# Patient Record
Sex: Male | Born: 1945 | Race: White | Hispanic: No | Marital: Married | Smoking: Never smoker
Health system: Southern US, Community
[De-identification: ages and names within clinical notes are randomized; demographics above are authoritative.]

## PROBLEM LIST (undated history)

## (undated) DIAGNOSIS — J189 Pneumonia, unspecified organism: Secondary | ICD-10-CM

## (undated) DIAGNOSIS — R972 Elevated prostate specific antigen [PSA]: Secondary | ICD-10-CM

## (undated) DIAGNOSIS — I10 Essential (primary) hypertension: Secondary | ICD-10-CM

## (undated) DIAGNOSIS — D126 Benign neoplasm of colon, unspecified: Secondary | ICD-10-CM

## (undated) DIAGNOSIS — K219 Gastro-esophageal reflux disease without esophagitis: Secondary | ICD-10-CM

## (undated) DIAGNOSIS — N400 Enlarged prostate without lower urinary tract symptoms: Secondary | ICD-10-CM

## (undated) DIAGNOSIS — C8510 Unspecified B-cell lymphoma, unspecified site: Secondary | ICD-10-CM

## (undated) DIAGNOSIS — N183 Chronic kidney disease, stage 3 unspecified: Secondary | ICD-10-CM

## (undated) DIAGNOSIS — K118 Other diseases of salivary glands: Secondary | ICD-10-CM

## (undated) DIAGNOSIS — J45909 Unspecified asthma, uncomplicated: Secondary | ICD-10-CM

## (undated) DIAGNOSIS — N200 Calculus of kidney: Secondary | ICD-10-CM

## (undated) DIAGNOSIS — Z973 Presence of spectacles and contact lenses: Secondary | ICD-10-CM

## (undated) HISTORY — PX: TONSILLECTOMY: SUR1361

## (undated) HISTORY — PX: LITHOTRIPSY: SUR834

## (undated) HISTORY — PX: COLONOSCOPY W/ BIOPSIES AND POLYPECTOMY: SHX1376

## (undated) HISTORY — PX: ROTATOR CUFF REPAIR: SHX139

---

## 2004-04-19 ENCOUNTER — Ambulatory Visit: Payer: Self-pay | Admitting: Orthopaedic Surgery

## 2004-05-03 ENCOUNTER — Ambulatory Visit: Payer: Self-pay | Admitting: Orthopaedic Surgery

## 2005-07-30 ENCOUNTER — Ambulatory Visit: Payer: Self-pay | Admitting: Internal Medicine

## 2006-10-02 ENCOUNTER — Ambulatory Visit: Payer: Self-pay | Admitting: Gastroenterology

## 2010-01-14 ENCOUNTER — Ambulatory Visit: Payer: Self-pay | Admitting: Sports Medicine

## 2010-07-26 ENCOUNTER — Ambulatory Visit: Payer: Self-pay | Admitting: Internal Medicine

## 2010-08-09 ENCOUNTER — Ambulatory Visit: Payer: Self-pay | Admitting: Rheumatology

## 2010-10-17 ENCOUNTER — Encounter: Payer: Self-pay | Admitting: Orthopedic Surgery

## 2010-11-04 ENCOUNTER — Encounter: Payer: Self-pay | Admitting: Orthopedic Surgery

## 2012-02-20 IMAGING — NM NUCLEAR MEDICINE WHOLE BODY BONE SCINTIGRAPHY
2 series · 8 of 8 positions shown · non-contrast
Comparison: none

REASON FOR EXAM: abd pain and lower back pain  abn CT scan  L1 scan
abnormal   dr req Whole Body
COMMENTS:

[Series 1000: statics · 2.40mm/px · 3 acquisitions, 6 frames shown]
[im 1/3]
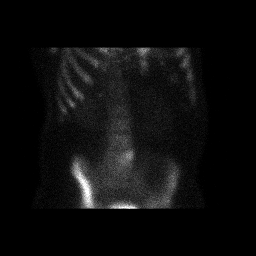
[im 1/3]
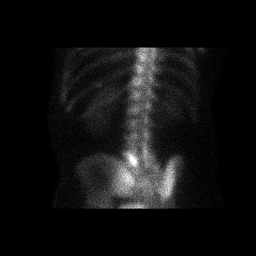
[im 2/3]
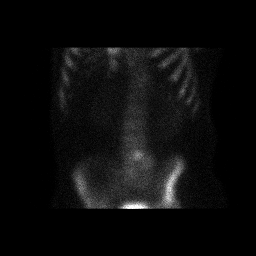
[im 2/3]
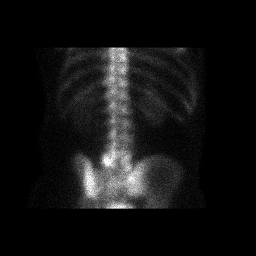
[im 3/3]
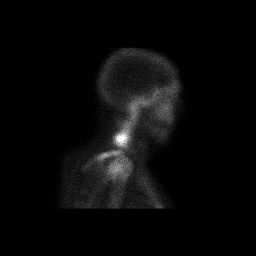
[im 3/3]
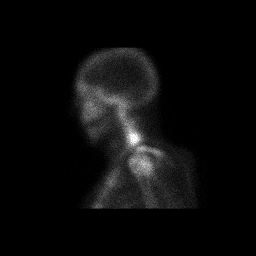

[Series 1000: 3 hr wholebody · 2.40mm/px · 2 of 2 frames shown]
[frame 1/2]
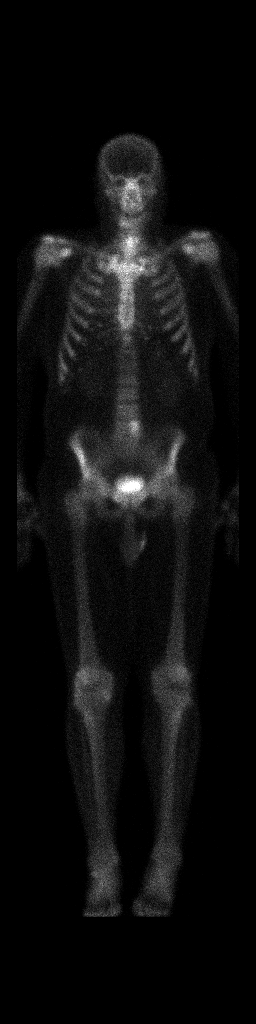
[frame 2/2]
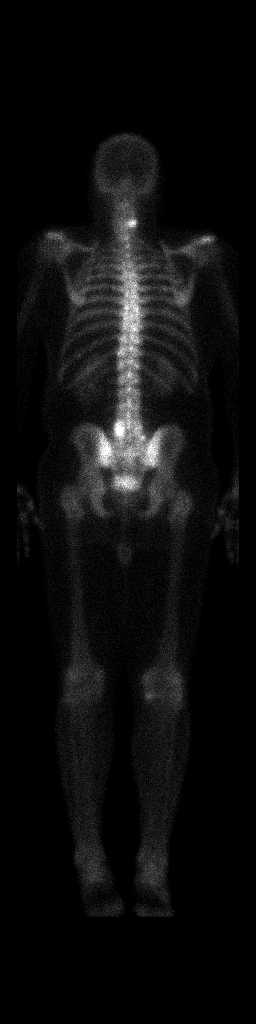

[8 of 8 positions shown; findings below may reference images not displayed]

PROCEDURE:     KNM - KNM BONE WB 3HR [DATE]  [DATE]

RESULT:     The patient received an injection of 23.69 mCi of technetium 99m
MDP. Anterior and posterior whole body images demonstrate focal increased
localization in the mid cervical region on the right and in the lower lumbar
region at L4 on the left with degenerative type uptake to a mild degree in
the knees and ankles. No abnormal localization is seen within the pelvis or
ribs. Uptake in the wrists is incompletely visualized and likely
degenerative in nature. The area of sclerosis on L1 seen on CT does not show
abnormal tracer uptake.
IMPRESSION: Likely degenerative changes in the spine in the cervical
and lumbar regions as well as in other joints as described.

## 2012-09-06 ENCOUNTER — Ambulatory Visit: Payer: Self-pay | Admitting: Gastroenterology

## 2013-06-24 ENCOUNTER — Observation Stay: Payer: Self-pay | Admitting: Internal Medicine

## 2013-06-24 LAB — CBC WITH DIFFERENTIAL/PLATELET
Basophil #: 0.1 10*3/uL (ref 0.0–0.1)
Basophil %: 0.7 %
EOS ABS: 0.5 10*3/uL (ref 0.0–0.7)
EOS PCT: 5 %
HCT: 42.4 % (ref 40.0–52.0)
HGB: 14.3 g/dL (ref 13.0–18.0)
LYMPHS ABS: 3.2 10*3/uL (ref 1.0–3.6)
Lymphocyte %: 34.8 %
MCH: 31.8 pg (ref 26.0–34.0)
MCHC: 33.7 g/dL (ref 32.0–36.0)
MCV: 94 fL (ref 80–100)
MONO ABS: 0.8 x10 3/mm (ref 0.2–1.0)
MONOS PCT: 8.5 %
Neutrophil #: 4.7 10*3/uL (ref 1.4–6.5)
Neutrophil %: 51 %
Platelet: 196 10*3/uL (ref 150–440)
RBC: 4.49 10*6/uL (ref 4.40–5.90)
RDW: 12.6 % (ref 11.5–14.5)
WBC: 9.1 10*3/uL (ref 3.8–10.6)

## 2013-06-24 LAB — URINALYSIS, COMPLETE
BACTERIA: NONE SEEN
BILIRUBIN, UR: NEGATIVE
Blood: NEGATIVE
Glucose,UR: NEGATIVE mg/dL (ref 0–75)
Ketone: NEGATIVE
Leukocyte Esterase: NEGATIVE
NITRITE: NEGATIVE
PH: 6 (ref 4.5–8.0)
PROTEIN: NEGATIVE
RBC, UR: NONE SEEN /HPF (ref 0–5)
SPECIFIC GRAVITY: 1.015 (ref 1.003–1.030)
Squamous Epithelial: <1
WBC UR: NONE SEEN /HPF (ref 0–5)

## 2013-06-24 LAB — BASIC METABOLIC PANEL
Anion Gap: 10 (ref 7–16)
BUN: 19 mg/dL — AB (ref 7–18)
CHLORIDE: 104 mmol/L (ref 98–107)
Calcium, Total: 8.9 mg/dL (ref 8.5–10.1)
Co2: 27 mmol/L (ref 21–32)
Creatinine: 0.99 mg/dL (ref 0.60–1.30)
EGFR (African American): >60
EGFR (Non-African Amer.): >60
GLUCOSE: 99 mg/dL (ref 65–99)
Osmolality: 284 (ref 275–301)
POTASSIUM: 3.6 mmol/L (ref 3.5–5.1)
Sodium: 141 mmol/L (ref 136–145)

## 2013-06-24 LAB — TROPONIN I: Troponin-I: <0.02 ng/mL

## 2013-06-25 LAB — LIPID PANEL
Cholesterol: 163 mg/dL (ref 0–200)
HDL Cholesterol: 37 mg/dL — ABNORMAL LOW (ref 40–60)
LDL CHOLESTEROL, CALC: 100 mg/dL (ref 0–100)
TRIGLYCERIDES: 130 mg/dL (ref 0–200)
VLDL CHOLESTEROL, CALC: 26 mg/dL (ref 5–40)

## 2013-06-25 LAB — SEDIMENTATION RATE: ERYTHROCYTE SED RATE: 10 mm/h (ref 0–20)

## 2013-06-26 LAB — CBC WITH DIFFERENTIAL/PLATELET
BASOS ABS: 0 10*3/uL (ref 0.0–0.1)
BASOS PCT: 0.6 %
Eosinophil #: 0.4 10*3/uL (ref 0.0–0.7)
Eosinophil %: 4.2 %
HCT: 39.2 % — AB (ref 40.0–52.0)
HGB: 13.3 g/dL (ref 13.0–18.0)
LYMPHS PCT: 25.6 %
Lymphocyte #: 2.2 10*3/uL (ref 1.0–3.6)
MCH: 31.7 pg (ref 26.0–34.0)
MCHC: 33.9 g/dL (ref 32.0–36.0)
MCV: 94 fL (ref 80–100)
Monocyte #: 0.8 x10 3/mm (ref 0.2–1.0)
Monocyte %: 8.9 %
NEUTROS PCT: 60.7 %
Neutrophil #: 5.2 10*3/uL (ref 1.4–6.5)
Platelet: 175 10*3/uL (ref 150–440)
RBC: 4.2 10*6/uL — ABNORMAL LOW (ref 4.40–5.90)
RDW: 12.3 % (ref 11.5–14.5)
WBC: 8.5 10*3/uL (ref 3.8–10.6)

## 2013-06-26 LAB — BASIC METABOLIC PANEL
Anion Gap: 2 — ABNORMAL LOW (ref 7–16)
BUN: 16 mg/dL (ref 7–18)
CO2: 28 mmol/L (ref 21–32)
CREATININE: 0.97 mg/dL (ref 0.60–1.30)
Calcium, Total: 8.7 mg/dL (ref 8.5–10.1)
Chloride: 109 mmol/L — ABNORMAL HIGH (ref 98–107)
EGFR (African American): >60
EGFR (Non-African Amer.): >60
Glucose: 93 mg/dL (ref 65–99)
Osmolality: 278 (ref 275–301)
POTASSIUM: 3.7 mmol/L (ref 3.5–5.1)
Sodium: 139 mmol/L (ref 136–145)

## 2014-02-06 ENCOUNTER — Ambulatory Visit: Payer: Self-pay | Admitting: Orthopedic Surgery

## 2014-03-15 ENCOUNTER — Other Ambulatory Visit (HOSPITAL_COMMUNITY): Payer: Self-pay | Admitting: Neurosurgery

## 2014-03-20 NOTE — Pre-Procedure Instructions (Addendum)
Sean Kidd  03/20/2014   Your procedure is scheduled on:  Thursday February 18th  Report to [facility] Admitting at 7:30 AM ( per MD)  Call this number if you have problems the morning of surgery: [phone]   If you have questions before your surgery call pre-admission [phone] between 8am-4pm M-F   Remember:   Do not eat food or drink liquids after midnight.   Take these medicines the morning of surgery with A SIP OF WATER: None  Stop taking Aspirin,vitamins, and herbal medications. Do not take any NSAIDs ie: Ibuprofen, Advil, Naproxen or any medication containing Aspirin; stop now.   Do not wear jewelry, make-up or nail polish.  Do not wear lotions, powders, or perfumes. You may not wear deodorant.  Do not shave 48 hours prior to surgery. Men may shave face and neck.  Do not bring valuables to the hospital.  [facility] is not responsible for any belongings or valuables.               Contacts, dentures or bridgework may not be worn into surgery.  Leave suitcase in the car. After surgery it may be brought to your room.  For patients admitted to the hospital, discharge time is determined by your treatment team.               Patients discharged the day of surgery will not be allowed to drive home.  Name and phone number of your driver:   Special Instructions: Special Instructions:Special Instructions: [facility] - Preparing for Surgery  Before surgery, you can play an important role.  Because skin is not sterile, your skin needs to be as free of germs as possible.  You can reduce the number of germs on you skin by washing with CHG (chlorahexidine gluconate) soap before surgery.  CHG is an antiseptic cleaner which kills germs and bonds with the skin to continue killing germs even after washing.  Please DO NOT use if you have an allergy to CHG or antibacterial soaps.  If your skin becomes reddened/irritated stop using the CHG and inform your nurse when you arrive at  Short Stay.  Do not shave (including legs and underarms) for at least 48 hours prior to the first CHG shower.  You may shave your face.  Please follow these instructions carefully:   1.  Shower with CHG Soap the night before surgery and the morning of Surgery.  2.  If you choose to wash your hair, wash your hair first as usual with your normal shampoo.  3.  After you shampoo, rinse your hair and body thoroughly to remove the Shampoo.  4.  Use CHG as you would any other liquid soap.  You can apply chg directly  to the skin and wash gently with scrungie or a clean washcloth.  5.  Apply the CHG Soap to your body ONLY FROM THE NECK DOWN.  Do not use on open wounds or open sores.  Avoid contact with your eyes, ears, mouth and genitals (private parts).  Wash genitals (private parts) with your normal soap.  6.  Wash thoroughly, paying special attention to the area where your surgery will be performed.  7.  Thoroughly rinse your body with warm water from the neck down.  8.  DO NOT shower/wash with your normal soap after using and rinsing off the CHG Soap.  9.  Pat yourself dry with a clean towel.  10.  Wear clean pajamas.            11.  Place clean sheets on your bed the night of your first shower and do not sleep with pets.  Day of Surgery  Do not apply any lotions/deodorants the morning of surgery.  Please wear clean clothes to the hospital/surgery center.   Please read over the following fact sheets that you were given: Pain Booklet, Coughing and Deep Breathing, MRSA Information and Surgical Site Infection Prevention

## 2014-03-21 ENCOUNTER — Encounter (HOSPITAL_COMMUNITY): Payer: Self-pay

## 2014-03-21 ENCOUNTER — Encounter (HOSPITAL_COMMUNITY)
Admission: RE | Admit: 2014-03-21 | Discharge: 2014-03-21 | Disposition: A | Discharge status: Home / Self Care | Payer: Medicare Other | Source: Ambulatory Visit | Attending: Neurosurgery | Admitting: Neurosurgery

## 2014-03-21 HISTORY — DX: Pneumonia, unspecified organism: J18.9

## 2014-03-21 HISTORY — DX: Calculus of kidney: N20.0

## 2014-03-21 HISTORY — DX: Essential (primary) hypertension: I10

## 2014-03-21 HISTORY — DX: Presence of spectacles and contact lenses: Z97.3

## 2014-03-21 HISTORY — DX: Unspecified asthma, uncomplicated: J45.909

## 2014-03-21 LAB — BASIC METABOLIC PANEL
Anion gap: 7 (ref 5–15)
BUN: 20 mg/dL (ref 6–23)
CALCIUM: 9.4 mg/dL (ref 8.4–10.5)
CHLORIDE: 107 mmol/L (ref 96–112)
CO2: 28 mmol/L (ref 19–32)
CREATININE: 0.88 mg/dL (ref 0.50–1.35)
GFR calc Af Amer: >90 mL/min (ref >90)
GFR calc non Af Amer: 85 mL/min — ABNORMAL LOW (ref >90)
Glucose, Bld: 103 mg/dL — ABNORMAL HIGH (ref 70–99)
Potassium: 3.9 mmol/L (ref 3.5–5.1)
Sodium: 142 mmol/L (ref 135–145)

## 2014-03-21 LAB — CBC
HCT: 40.5 % (ref 39.0–52.0)
Hemoglobin: 13.8 g/dL (ref 13.0–17.0)
MCH: 31.4 pg (ref 26.0–34.0)
MCHC: 34.1 g/dL (ref 30.0–36.0)
MCV: 92 fL (ref 78.0–100.0)
PLATELETS: 205 10*3/uL (ref 150–400)
RBC: 4.4 MIL/uL (ref 4.22–5.81)
RDW: 12.7 % (ref 11.5–15.5)
WBC: 8.9 10*3/uL (ref 4.0–10.5)

## 2014-03-21 LAB — SURGICAL PCR SCREEN
MRSA, PCR: NEGATIVE
Staphylococcus aureus: NEGATIVE

## 2014-03-21 NOTE — Progress Notes (Signed)
   03/21/14 1445  OBSTRUCTIVE SLEEP APNEA  Have you ever been diagnosed with sleep apnea through a sleep study? No  Do you snore loudly (loud enough to be heard through closed doors)?  0  Do you often feel tired, fatigued, or sleepy during the daytime? 0  Has anyone observed you stop breathing during your sleep? 0  Do you have, or are you being treated for high blood pressure? 1  BMI more than 35 kg/m2? 0  Age over 71 years old? 1  Neck circumference greater than 40 cm/16 inches? 1  Gender: 1  Obstructive Sleep Apnea Score 4

## 2014-03-21 NOTE — Progress Notes (Signed)
Pt denies SOB, chest pain, and being under the care of a cardiologist. Pt denies having a chest x ray within the last year. Pt denies having a stress test and cardiac cath but stated that he had an EKG and echo at [facility]; records requested

## 2014-03-22 MED ORDER — CEFAZOLIN SODIUM-DEXTROSE 2-3 GM-% IV SOLR
2.0000 g | INTRAVENOUS | Status: AC
  Administered 2014-03-23: 2 g via INTRAVENOUS
  Filled 2014-03-22: qty 50

## 2014-03-22 NOTE — Progress Notes (Signed)
Anesthesia Chart Review: Patient is a 69 year old male scheduled for left L3-4 laminectomy and foraminotomy for synovial cyst on 03/23/14 by Dr. Arnoldo Morale.  History includes non-smoker, asthma, HTN. He was admitted to [facility] on 06/24/13 with visual changes and headache.  By notes MRI, carotid, echo, and sed rate were "normal."  There was question if he had a TIA. BMI is consistent with mild obesity.  OSA screening score is 4. PCP is listed as Dr. Emily Filbert (see Care Everywhere).   06/24/13 EKG [facility]): NSR, inferior infarct (age undetermined), cannot rule out anterior infarct (age undetermined).  Confirming physician indicates, "When compared with ECG of 21-Apr-2000 13:53, No significant change was found."  06/25/13 Echo [facility]): LV internal cavity size is normal. LV septal wall thickness was normal. LV posterior wall thickness was normal. Global LV systolic function was normal. LVEF by visual estimation is 55-60%. RV size is mildly enlarged. LA is mildly dilated. Trace MR.   06/25/13 Carotid duplex [facility]): No hemodynamically significant stenosis or plaque is noted in either cervical carotid artery.   Preoperative labs noted.   By records, his EKG has been stable since 2002.  His echo within the past year was unremarkable.  No CV symptoms reported at his PAT RN visit.  If no acute changes then I would anticipate that he could proceed as planned.  George Hugh [facility] Short Stay Center/Anesthesiology Phone [phone] 03/22/2014 11:55 AM

## 2014-03-23 ENCOUNTER — Inpatient Hospital Stay (HOSPITAL_COMMUNITY)
Admission: RE | Admit: 2014-03-23 | Discharge: 2014-03-24 | DRG: 520 | Disposition: A | Discharge status: Home / Self Care | Payer: Medicare Other | Source: Ambulatory Visit | Attending: Neurosurgery | Admitting: Neurosurgery

## 2014-03-23 ENCOUNTER — Encounter (HOSPITAL_COMMUNITY): Payer: Self-pay | Admitting: *Deleted

## 2014-03-23 ENCOUNTER — Encounter (HOSPITAL_COMMUNITY)
Admission: RE | Disposition: A | Discharge status: Home / Self Care | Payer: Self-pay | Source: Ambulatory Visit | Attending: Neurosurgery

## 2014-03-23 ENCOUNTER — Inpatient Hospital Stay (HOSPITAL_COMMUNITY): Payer: Medicare Other

## 2014-03-23 ENCOUNTER — Inpatient Hospital Stay (HOSPITAL_COMMUNITY): Payer: Medicare Other | Admitting: Vascular Surgery

## 2014-03-23 ENCOUNTER — Inpatient Hospital Stay (HOSPITAL_COMMUNITY): Payer: Medicare Other | Admitting: Anesthesiology

## 2014-03-23 DIAGNOSIS — Z86010 Personal history of colonic polyps: Secondary | ICD-10-CM

## 2014-03-23 DIAGNOSIS — Z79899 Other long term (current) drug therapy: Secondary | ICD-10-CM | POA: Diagnosis not present

## 2014-03-23 DIAGNOSIS — M4806 Spinal stenosis, lumbar region: Secondary | ICD-10-CM | POA: Diagnosis present

## 2014-03-23 DIAGNOSIS — I10 Essential (primary) hypertension: Secondary | ICD-10-CM | POA: Diagnosis present

## 2014-03-23 DIAGNOSIS — M7138 Other bursal cyst, other site: Principal | ICD-10-CM | POA: Diagnosis present

## 2014-03-23 DIAGNOSIS — M5416 Radiculopathy, lumbar region: Secondary | ICD-10-CM | POA: Diagnosis present

## 2014-03-23 DIAGNOSIS — M4316 Spondylolisthesis, lumbar region: Secondary | ICD-10-CM | POA: Diagnosis present

## 2014-03-23 DIAGNOSIS — Z87442 Personal history of urinary calculi: Secondary | ICD-10-CM

## 2014-03-23 DIAGNOSIS — J45909 Unspecified asthma, uncomplicated: Secondary | ICD-10-CM | POA: Diagnosis present

## 2014-03-23 DIAGNOSIS — M7130 Other bursal cyst, unspecified site: Secondary | ICD-10-CM

## 2014-03-23 DIAGNOSIS — Z8701 Personal history of pneumonia (recurrent): Secondary | ICD-10-CM

## 2014-03-23 HISTORY — PX: LUMBAR LAMINECTOMY/DECOMPRESSION MICRODISCECTOMY: SHX5026

## 2014-03-23 SURGERY — LUMBAR LAMINECTOMY/DECOMPRESSION MICRODISCECTOMY 1 LEVEL
Anesthesia: General | Site: Back | Laterality: Left

## 2014-03-23 MED ORDER — FENTANYL CITRATE 0.05 MG/ML IJ SOLN
INTRAMUSCULAR | Status: AC
  Filled 2014-03-23: qty 5

## 2014-03-23 MED ORDER — ACETAMINOPHEN 650 MG RE SUPP: 650.0000 mg | RECTAL | Status: DC | PRN

## 2014-03-23 MED ORDER — ONDANSETRON HCL 4 MG/2ML IJ SOLN
INTRAMUSCULAR | Status: AC
  Filled 2014-03-23: qty 2

## 2014-03-23 MED ORDER — THROMBIN 5000 UNITS EX SOLR
CUTANEOUS | Status: DC | PRN
  Administered 2014-03-23 (×2): 5000 [IU] via TOPICAL

## 2014-03-23 MED ORDER — PHENYLEPHRINE HCL 10 MG/ML IJ SOLN
INTRAMUSCULAR | Status: DC | PRN
  Administered 2014-03-23 (×5): 80 ug via INTRAVENOUS

## 2014-03-23 MED ORDER — FENTANYL CITRATE 0.05 MG/ML IJ SOLN
INTRAMUSCULAR | Status: DC | PRN
  Administered 2014-03-23: 100 ug via INTRAVENOUS

## 2014-03-23 MED ORDER — SUCCINYLCHOLINE CHLORIDE 20 MG/ML IJ SOLN
INTRAMUSCULAR | Status: AC
  Filled 2014-03-23: qty 1

## 2014-03-23 MED ORDER — HYDROCODONE-ACETAMINOPHEN 5-325 MG PO TABS: 1.0000 | ORAL_TABLET | ORAL | Status: DC | PRN

## 2014-03-23 MED ORDER — LIDOCAINE HCL (CARDIAC) 20 MG/ML IV SOLN
INTRAVENOUS | Status: DC | PRN
  Administered 2014-03-23: 60 mg via INTRAVENOUS

## 2014-03-23 MED ORDER — HYDROMORPHONE HCL 1 MG/ML IJ SOLN
INTRAMUSCULAR | Status: AC
  Filled 2014-03-23: qty 1

## 2014-03-23 MED ORDER — BACITRACIN ZINC 500 UNIT/GM EX OINT
TOPICAL_OINTMENT | CUTANEOUS | Status: DC | PRN
  Administered 2014-03-23: 1 via TOPICAL

## 2014-03-23 MED ORDER — GLYCOPYRROLATE 0.2 MG/ML IJ SOLN
INTRAMUSCULAR | Status: DC | PRN
  Administered 2014-03-23: 0.4 mg via INTRAVENOUS

## 2014-03-23 MED ORDER — ACETAMINOPHEN 325 MG PO TABS: 650.0000 mg | ORAL_TABLET | ORAL | Status: DC | PRN

## 2014-03-23 MED ORDER — ROCURONIUM BROMIDE 100 MG/10ML IV SOLN
INTRAVENOUS | Status: DC | PRN
  Administered 2014-03-23: 20 mg via INTRAVENOUS

## 2014-03-23 MED ORDER — PHENYLEPHRINE 40 MCG/ML (10ML) SYRINGE FOR IV PUSH (FOR BLOOD PRESSURE SUPPORT)
PREFILLED_SYRINGE | INTRAVENOUS | Status: AC
  Filled 2014-03-23: qty 10

## 2014-03-23 MED ORDER — ONDANSETRON HCL 4 MG/2ML IJ SOLN
4.0000 mg | INTRAMUSCULAR | Status: DC | PRN
Start: 2014-03-23 — End: 2014-03-24

## 2014-03-23 MED ORDER — DIAZEPAM 5 MG PO TABS: 5.0000 mg | ORAL_TABLET | Freq: Four times a day (QID) | ORAL | Status: DC | PRN

## 2014-03-23 MED ORDER — ONDANSETRON HCL 4 MG/2ML IJ SOLN
INTRAMUSCULAR | Status: DC | PRN
Start: 2014-03-23 — End: 2014-03-23
  Administered 2014-03-23: 4 mg via INTRAVENOUS

## 2014-03-23 MED ORDER — ROCURONIUM BROMIDE 50 MG/5ML IV SOLN
INTRAVENOUS | Status: AC
  Filled 2014-03-23: qty 1

## 2014-03-23 MED ORDER — MIDAZOLAM HCL 5 MG/5ML IJ SOLN
INTRAMUSCULAR | Status: DC | PRN
  Administered 2014-03-23: 2 mg via INTRAVENOUS

## 2014-03-23 MED ORDER — HYDROMORPHONE HCL 1 MG/ML IJ SOLN
0.2500 mg | INTRAMUSCULAR | Status: DC | PRN
  Administered 2014-03-23 (×2): 0.5 mg via INTRAVENOUS

## 2014-03-23 MED ORDER — LISINOPRIL 20 MG PO TABS
20.0000 mg | ORAL_TABLET | Freq: Every day | ORAL | Status: DC
  Administered 2014-03-23: 20 mg via ORAL
  Filled 2014-03-23: qty 1

## 2014-03-23 MED ORDER — BUPIVACAINE-EPINEPHRINE (PF) 0.5% -1:200000 IJ SOLN
INTRAMUSCULAR | Status: DC | PRN
  Administered 2014-03-23: 10 mL via PERINEURAL

## 2014-03-23 MED ORDER — MIDAZOLAM HCL 2 MG/2ML IJ SOLN
INTRAMUSCULAR | Status: AC
  Filled 2014-03-23: qty 2

## 2014-03-23 MED ORDER — DOCUSATE SODIUM 100 MG PO CAPS
100.0000 mg | ORAL_CAPSULE | Freq: Two times a day (BID) | ORAL | Status: DC
  Administered 2014-03-23: 100 mg via ORAL
  Filled 2014-03-23: qty 1

## 2014-03-23 MED ORDER — NEOSTIGMINE METHYLSULFATE 10 MG/10ML IV SOLN
INTRAVENOUS | Status: AC
  Filled 2014-03-23: qty 1

## 2014-03-23 MED ORDER — NEOSTIGMINE METHYLSULFATE 10 MG/10ML IV SOLN
INTRAVENOUS | Status: DC | PRN
  Administered 2014-03-23: 3 mg via INTRAVENOUS

## 2014-03-23 MED ORDER — LACTATED RINGERS IV SOLN
INTRAVENOUS | Status: DC
  Administered 2014-03-23: 18:00:00 via INTRAVENOUS

## 2014-03-23 MED ORDER — LISINOPRIL-HYDROCHLOROTHIAZIDE 20-12.5 MG PO TABS: 1.0000 | ORAL_TABLET | Freq: Every day | ORAL | Status: DC

## 2014-03-23 MED ORDER — SUCCINYLCHOLINE CHLORIDE 20 MG/ML IJ SOLN
INTRAMUSCULAR | Status: DC | PRN
  Administered 2014-03-23: 100 mg via INTRAVENOUS

## 2014-03-23 MED ORDER — PHENOL 1.4 % MT LIQD: 1.0000 | OROMUCOSAL | Status: DC | PRN

## 2014-03-23 MED ORDER — PROPOFOL 10 MG/ML IV BOLUS
INTRAVENOUS | Status: AC
  Filled 2014-03-23: qty 20

## 2014-03-23 MED ORDER — HEMOSTATIC AGENTS (NO CHARGE) OPTIME
TOPICAL | Status: DC | PRN
  Administered 2014-03-23: 1 via TOPICAL

## 2014-03-23 MED ORDER — PROPOFOL 10 MG/ML IV BOLUS
INTRAVENOUS | Status: DC | PRN
  Administered 2014-03-23: 130 mg via INTRAVENOUS

## 2014-03-23 MED ORDER — LACTATED RINGERS IV SOLN
INTRAVENOUS | Status: DC
  Administered 2014-03-23 (×2): via INTRAVENOUS

## 2014-03-23 MED ORDER — MORPHINE SULFATE 2 MG/ML IJ SOLN: 1.0000 mg | INTRAMUSCULAR | Status: DC | PRN

## 2014-03-23 MED ORDER — ALUM & MAG HYDROXIDE-SIMETH 200-200-20 MG/5ML PO SUSP: 30.0000 mL | Freq: Four times a day (QID) | ORAL | Status: DC | PRN

## 2014-03-23 MED ORDER — CEFAZOLIN SODIUM-DEXTROSE 2-3 GM-% IV SOLR
2.0000 g | Freq: Three times a day (TID) | INTRAVENOUS | Status: AC
  Administered 2014-03-23 – 2014-03-24 (×2): 2 g via INTRAVENOUS
  Filled 2014-03-23 (×2): qty 50

## 2014-03-23 MED ORDER — OXYCODONE-ACETAMINOPHEN 5-325 MG PO TABS
1.0000 | ORAL_TABLET | ORAL | Status: DC | PRN
  Administered 2014-03-23 – 2014-03-24 (×3): 2 via ORAL
  Filled 2014-03-23 (×3): qty 2

## 2014-03-23 MED ORDER — 0.9 % SODIUM CHLORIDE (POUR BTL) OPTIME
TOPICAL | Status: DC | PRN
  Administered 2014-03-23: 1000 mL

## 2014-03-23 MED ORDER — SODIUM CHLORIDE 0.9 % IR SOLN
Status: DC | PRN
  Administered 2014-03-23: 500 mL

## 2014-03-23 MED ORDER — MENTHOL 3 MG MT LOZG: 1.0000 | LOZENGE | OROMUCOSAL | Status: DC | PRN

## 2014-03-23 MED ORDER — GLYCOPYRROLATE 0.2 MG/ML IJ SOLN
INTRAMUSCULAR | Status: AC
  Filled 2014-03-23: qty 2

## 2014-03-23 MED ORDER — HYDROCHLOROTHIAZIDE 12.5 MG PO CAPS
12.5000 mg | ORAL_CAPSULE | Freq: Every day | ORAL | Status: DC
  Administered 2014-03-23: 12.5 mg via ORAL
  Filled 2014-03-23: qty 1

## 2014-03-23 SURGICAL SUPPLY — 58 items
BAG DECANTER FOR FLEXI CONT (MISCELLANEOUS) ×3 IMPLANT
BENZOIN TINCTURE PRP APPL 2/3 (GAUZE/BANDAGES/DRESSINGS) ×3 IMPLANT
BLADE CLIPPER SURG (BLADE) IMPLANT
BRUSH SCRUB EZ PLAIN DRY (MISCELLANEOUS) ×3 IMPLANT
BUR MATCHSTICK NEURO 3.0 LAGG (BURR) ×3 IMPLANT
BUR PRECISION FLUTE 6.0 (BURR) ×3 IMPLANT
CANISTER SUCT 3000ML PPV (MISCELLANEOUS) ×3 IMPLANT
CLOSURE WOUND 1/2 X4 (GAUZE/BANDAGES/DRESSINGS) ×1
CONT SPEC 4OZ CLIKSEAL STRL BL (MISCELLANEOUS) ×3 IMPLANT
DRAPE LAPAROTOMY 100X72X124 (DRAPES) ×3 IMPLANT
DRAPE MICROSCOPE LEICA (MISCELLANEOUS) ×3 IMPLANT
DRAPE POUCH INSTRU U-SHP 10X18 (DRAPES) ×3 IMPLANT
DRAPE SURG 17X23 STRL (DRAPES) ×12 IMPLANT
ELECT BLADE 4.0 EZ CLEAN MEGAD (MISCELLANEOUS) ×3
ELECT REM PT RETURN 9FT ADLT (ELECTROSURGICAL) ×3
ELECTRODE BLDE 4.0 EZ CLN MEGD (MISCELLANEOUS) ×1 IMPLANT
ELECTRODE REM PT RTRN 9FT ADLT (ELECTROSURGICAL) ×1 IMPLANT
GAUZE SPONGE 4X4 12PLY STRL (GAUZE/BANDAGES/DRESSINGS) ×3 IMPLANT
GAUZE SPONGE 4X4 16PLY XRAY LF (GAUZE/BANDAGES/DRESSINGS) IMPLANT
GLOVE BIO SURGEON STRL SZ8.5 (GLOVE) ×3 IMPLANT
GLOVE BIOGEL PI IND STRL 7.5 (GLOVE) ×1 IMPLANT
GLOVE BIOGEL PI IND STRL 8 (GLOVE) ×1 IMPLANT
GLOVE BIOGEL PI INDICATOR 7.5 (GLOVE) ×2
GLOVE BIOGEL PI INDICATOR 8 (GLOVE) ×2
GLOVE ECLIPSE 8.0 STRL XLNG CF (GLOVE) ×3 IMPLANT
GLOVE EXAM NITRILE LRG STRL (GLOVE) IMPLANT
GLOVE EXAM NITRILE MD LF STRL (GLOVE) IMPLANT
GLOVE EXAM NITRILE XL STR (GLOVE) IMPLANT
GLOVE EXAM NITRILE XS STR PU (GLOVE) IMPLANT
GLOVE SS BIOGEL STRL SZ 8 (GLOVE) ×1 IMPLANT
GLOVE SS N UNI LF 7.0 STRL (GLOVE) ×9 IMPLANT
GLOVE SUPERSENSE BIOGEL SZ 8 (GLOVE) ×2
GOWN STRL REUS W/ TWL LRG LVL3 (GOWN DISPOSABLE) ×1 IMPLANT
GOWN STRL REUS W/ TWL XL LVL3 (GOWN DISPOSABLE) ×2 IMPLANT
GOWN STRL REUS W/TWL 2XL LVL3 (GOWN DISPOSABLE) IMPLANT
GOWN STRL REUS W/TWL LRG LVL3 (GOWN DISPOSABLE) ×2
GOWN STRL REUS W/TWL XL LVL3 (GOWN DISPOSABLE) ×4
KIT BASIN OR (CUSTOM PROCEDURE TRAY) ×3 IMPLANT
KIT ROOM TURNOVER OR (KITS) ×3 IMPLANT
NEEDLE HYPO 21X1.5 SAFETY (NEEDLE) IMPLANT
NEEDLE HYPO 22GX1.5 SAFETY (NEEDLE) ×3 IMPLANT
NS IRRIG 1000ML POUR BTL (IV SOLUTION) ×3 IMPLANT
PACK LAMINECTOMY NEURO (CUSTOM PROCEDURE TRAY) ×3 IMPLANT
PAD ARMBOARD 7.5X6 YLW CONV (MISCELLANEOUS) ×9 IMPLANT
PATTIES SURGICAL .5 X1 (DISPOSABLE) IMPLANT
RUBBERBAND STERILE (MISCELLANEOUS) ×6 IMPLANT
SPONGE SURGIFOAM ABS GEL SZ50 (HEMOSTASIS) ×3 IMPLANT
STRIP CLOSURE SKIN 1/2X4 (GAUZE/BANDAGES/DRESSINGS) ×2 IMPLANT
SUT VIC AB 1 CT1 18XBRD ANBCTR (SUTURE) ×1 IMPLANT
SUT VIC AB 1 CT1 8-18 (SUTURE) ×2
SUT VIC AB 2-0 CP2 18 (SUTURE) ×3 IMPLANT
SYR 20CC LL (SYRINGE) IMPLANT
SYR 20ML ECCENTRIC (SYRINGE) ×3 IMPLANT
TAPE CLOTH SURG 4X10 WHT LF (GAUZE/BANDAGES/DRESSINGS) ×3 IMPLANT
TAPE STRIPS DRAPE STRL (GAUZE/BANDAGES/DRESSINGS) ×3 IMPLANT
TOWEL OR 17X24 6PK STRL BLUE (TOWEL DISPOSABLE) ×3 IMPLANT
TOWEL OR 17X26 10 PK STRL BLUE (TOWEL DISPOSABLE) ×3 IMPLANT
WATER STERILE IRR 1000ML POUR (IV SOLUTION) ×3 IMPLANT

## 2014-03-23 NOTE — Anesthesia Preprocedure Evaluation (Addendum)
Anesthesia Evaluation  Patient identified by MRN, date of birth, ID band Patient awake    Reviewed: Allergy & Precautions, H&P , NPO status , Patient's Chart, lab work & pertinent test results  Airway Mallampati: III  TM Distance: >3 FB Neck ROM: Full    Dental no notable dental hx. (+) Teeth Intact, Dental Advisory Given   Pulmonary asthma ,  breath sounds clear to auscultation  Pulmonary exam normal       Cardiovascular hypertension, Pt. on medications Rhythm:Regular Rate:Normal     Neuro/Psych negative neurological ROS  negative psych ROS   GI/Hepatic negative GI ROS, Neg liver ROS,   Endo/Other  negative endocrine ROS  Renal/GU negative Renal ROS  negative genitourinary   Musculoskeletal   Abdominal   Peds  Hematology negative hematology ROS (+)   Anesthesia Other Findings   Reproductive/Obstetrics negative OB ROS                            Anesthesia Physical Anesthesia Plan  ASA: II  Anesthesia Plan: General   Post-op Pain Management:    Induction: Intravenous  Airway Management Planned: Oral ETT  Additional Equipment:   Intra-op Plan:   Post-operative Plan: Extubation in OR  Informed Consent: I have reviewed the patients History and Physical, chart, labs and discussed the procedure including the risks, benefits and alternatives for the proposed anesthesia with the patient or authorized representative who has indicated his/her understanding and acceptance.   Dental advisory given  Plan Discussed with: CRNA  Anesthesia Plan Comments:         Anesthesia Quick Evaluation

## 2014-03-23 NOTE — Anesthesia Procedure Notes (Signed)
Procedure Name: Intubation Date/Time: 03/23/2014 12:12 PM Performed by: Julian Reil Pre-anesthesia Checklist: Patient identified, Emergency Drugs available, Suction available and Patient being monitored Patient Re-evaluated:Patient Re-evaluated prior to inductionOxygen Delivery Method: Circle system utilized Preoxygenation: Pre-oxygenation with 100% oxygen Intubation Type: IV induction Ventilation: Mask ventilation without difficulty Grade View: Grade I Tube type: Oral Tube size: 7.5 mm Number of attempts: 1 Airway Equipment and Method: Stylet and Video-laryngoscopy Placement Confirmation: ETT inserted through vocal cords under direct vision,  positive ETCO2 and breath sounds checked- equal and bilateral Secured at: 21 cm Tube secured with: Tape Dental Injury: Teeth and Oropharynx as per pre-operative assessment  Comments: Glidescope used d/t anticipated difficult airway. Grade I view with glide. Atraumatic intubation.

## 2014-03-23 NOTE — Transfer of Care (Signed)
Immediate Anesthesia Transfer of Care Note  Patient: Sean Kidd  Procedure(s) Performed: Procedure(s) with comments: LUMBAR LAMINECTOMY/DECOMPRESSION MICRODISCECTOMY 1 LEVEL (Left) - Left L34 Laminectomy and foraminotomy for synovial cyst  Patient Location: PACU  Anesthesia Type:General  Level of Consciousness: awake, alert , oriented and patient cooperative  Airway & Oxygen Therapy: Patient Spontanous Breathing and Patient connected to nasal cannula oxygen  Post-op Assessment: Report given to RN, Post -op Vital signs reviewed and stable and Patient moving all extremities  Post vital signs: Reviewed and stable  Last Vitals:  Filed Vitals:   03/23/14 0758  BP: 172/79  Pulse: 66  Temp: 36.7 C  Resp: 20    Complications: No apparent anesthesia complications

## 2014-03-23 NOTE — Progress Notes (Signed)
Patient ambulated well with one assist and a walker to the bathroom and back to bed. Patient's pain in control. Will continue to monitor. Irene Mitcham, Rande Brunt

## 2014-03-23 NOTE — Progress Notes (Signed)
Subjective:  The patient is somnolent but easily arousable. He is in no apparent distress. He looks well.  Objective: Vital signs in last 24 hours: Temp:  [98 F (36.7 C)] 98 F (36.7 C) (02/18 0758) Pulse Rate:  [66] 66 (02/18 0758) Resp:  [20] 20 (02/18 0758) BP: (172)/(79) 172/79 mmHg (02/18 0758) SpO2:  [98 %] 98 % (02/18 0758) Weight:  [109.77 kg (242 lb)] 109.77 kg (242 lb) (02/18 0758)  Intake/Output from previous day:   Intake/Output this shift: Total I/O In: 1600 [I.V.:1600] Out: 50 [Blood:50]  Physical exam the patient is somnolent but easily arousable. He is moving his lower extremities well.  Lab Results:  Recent Labs  03/21/14 1522  WBC 8.9  HGB 13.8  HCT 40.5  PLT 205   BMET  Recent Labs  03/21/14 1522  NA 142  K 3.9  CL 107  CO2 28  GLUCOSE 103*  BUN 20  CREATININE 0.88  CALCIUM 9.4    Studies/Results: No results found.  Assessment/Plan: The patient is doing well.  LOS: 0 days     Alexandria Current D 03/23/2014, 2:00 PM

## 2014-03-23 NOTE — Anesthesia Postprocedure Evaluation (Signed)
  Anesthesia Post-op Note  Patient: Sean Kidd  Procedure(s) Performed: Procedure(s) with comments: LUMBAR LAMINECTOMY/DECOMPRESSION MICRODISCECTOMY 1 LEVEL (Left) - Left L34 Laminectomy and foraminotomy for synovial cyst  Patient Location: PACU  Anesthesia Type:General  Level of Consciousness: awake and alert   Airway and Oxygen Therapy: Patient Spontanous Breathing  Post-op Pain: mild  Post-op Assessment: Post-op Vital signs reviewed, Patient's Cardiovascular Status Stable and Respiratory Function Stable  Post-op Vital Signs: Reviewed  Filed Vitals:   03/23/14 1415  BP: 129/66  Pulse: 75  Temp:   Resp: 15    Complications: No apparent anesthesia complications

## 2014-03-23 NOTE — H&P (Signed)
Subjective: The patient is a 69 year old white male who has complained of back, left buttock, and left leg pain consistent with a lumbar radiculopathy. He has failed medical management and was worked up with a lumbar MRI which demonstrated a synovial cyst at L3-4 left as well as an L4-5 spondylolisthesis. I discussed the various treatment options with the patient including surgery. He has decided to proceed with a left L3-4 laminectomy and removal of the synovial cyst.   Past Medical History  Diagnosis Date  . Wears glasses   . Kidney stones   . Hypertension   . Asthma     as a child only  . Pneumonia     Past Surgical History  Procedure Laterality Date  . Colonoscopy w/ biopsies and polypectomy    . Rotator cuff repair      x 3  . Tonsillectomy    . Lithotripsy      for kidney stones    No Known Allergies  History  Substance Use Topics  . Smoking status: Never Smoker   . Smokeless tobacco: Never Used  . Alcohol Use: Not on file     Comment: social wine and beer    Family History  Problem Relation Age of Onset  . Cancer Mother   . Cancer - Lung Father   . Prostate cancer Brother    Prior to Admission medications   Medication Sig Start Date End Date Taking? Authorizing Provider  lisinopril-hydrochlorothiazide (PRINZIDE,ZESTORETIC) 20-12.5 MG per tablet Take 1 tablet by mouth daily.   Yes Historical Provider, MD     Review of Systems  Positive ROS: As above  All other systems have been reviewed and were otherwise negative with the exception of those mentioned in the HPI and as above.  Objective: Vital signs in last 24 hours: Temp:  [98 F (36.7 C)] 98 F (36.7 C) (02/18 0758) Pulse Rate:  [66] 66 (02/18 0758) Resp:  [20] 20 (02/18 0758) BP: (172)/(79) 172/79 mmHg (02/18 0758) SpO2:  [98 %] 98 % (02/18 0758) Weight:  [109.77 kg (242 lb)] 109.77 kg (242 lb) (02/18 0758)  General Appearance: Alert, cooperative, no distress, Head: Normocephalic, without obvious  abnormality, atraumatic Eyes: PERRL, conjunctiva/corneas clear, EOM's intact,    Ears: Normal  Throat: Normal  Neck: Supple, symmetrical, trachea midline, no adenopathy; thyroid: No enlargement/tenderness/nodules; no carotid bruit or JVD Back: Symmetric, no curvature, ROM normal, no CVA tenderness Lungs: Clear to auscultation bilaterally, respirations unlabored Heart: Regular rate and rhythm, no murmur, rub or gallop Abdomen: Soft, non-tender,, no masses, no organomegaly Extremities: Extremities normal, atraumatic, no cyanosis or edema Pulses: 2+ and symmetric all extremities Skin: Skin color, texture, turgor normal, no rashes or lesions  NEUROLOGIC:   Mental status: alert and oriented, no aphasia, good attention span, Fund of knowledge/ memory ok Motor Exam - grossly normal Sensory Exam - grossly normal Reflexes:  Coordination - grossly normal Gait - grossly normal Balance - grossly normal Cranial Nerves: I: smell Not tested  II: visual acuity  OS: Normal  OD: Normal   II: visual fields Full to confrontation  II: pupils Equal, round, reactive to light  III,VII: ptosis None  III,IV,VI: extraocular muscles  Full ROM  V: mastication Normal  V: facial light touch sensation  Normal  V,VII: corneal reflex  Present  VII: facial muscle function - upper  Normal  VII: facial muscle function - lower Normal  VIII: hearing Not tested  IX: soft palate elevation  Normal  IX,X: gag reflex  Present  XI: trapezius strength  5/5  XI: sternocleidomastoid strength 5/5  XI: neck flexion strength  5/5  XII: tongue strength  Normal    Data Review Lab Results  Component Value Date   WBC 8.9 03/21/2014   HGB 13.8 03/21/2014   HCT 40.5 03/21/2014   MCV 92.0 03/21/2014   PLT 205 03/21/2014   Lab Results  Component Value Date   NA 142 03/21/2014   K 3.9 03/21/2014   CL 107 03/21/2014   CO2 28 03/21/2014   BUN 20 03/21/2014   CREATININE 0.88 03/21/2014   GLUCOSE 103* 03/21/2014   No  results found for: INR, PROTIME  Assessment/Plan: 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 I have reviewed his imaging studies with him and pointed out the abnormality is. We have discussed the various treatment options including surgery. I have described the surgical treatment option of an left L3-4 laminectomy and removal of the synovial cyst. I have shown him surgical models. We have discussed the risks, benefits, alternatives, and likelihood of achieving her goals with surgery. I have answered all the patient's questions. He has decided to proceed with surgery.   Dakai Braithwaite D 03/23/2014 12:00 PM

## 2014-03-23 NOTE — Progress Notes (Signed)
Pt admitted to 4N7.  VSS; alert and oriented. Reports pre-op pain improved in left leg and buttock and now describes surgical pain.  Medicated with Dilaudid prior to transfer to floor.  Drsg CDI; Family at bedside. SCD's on, bed alarm set, and call bell within reach.  Pt verbalizes understanding of calling for assistance before attempting to get OOB.  Will continue to monitor.

## 2014-03-23 NOTE — Op Note (Signed)
Brief history: The patient is a 69 year old white male who has complained of back and left buttock and leg pain consistent with a lumbar radiculopathy/neurogenic claudication. He has failed medical management and was worked up with a lumbar MRI. The MRI demonstrated an L4-5 spondylolisthesis with a synovial cyst at L3-4 on the left. I discussed the various treatment options with the patient including surgery. He has weighed the risks, benefits, and alternative surgery and decided proceed with a left L3-4 laminectomy for removal of synovial cyst.  Preoperative diagnosis: L4-5 spondylolisthesis, L3-4 synovial cyst, spinal stenosis, lumbago, lumbar radiculopathy  Postoperative diagnosis: The same  Procedure: Left L3 and L4 laminotomy/foraminotomy for resection of synovial cyst to decompress the left L3 and left L4 nerve roots  using micro-dissection  Surgeon: Dr. Earle Gell  Asst.: Dr. Jovita Gamma  Anesthesia: Gen. endotracheal  Estimated blood loss: Minimal  Drains: None  Complications: None  Description of procedure: The patient was brought to the operating room by the anesthesia team. General endotracheal anesthesia was induced. The patient was turned to the prone position on the Wilson frame. The patient's lumbosacral region was then prepared with Betadine scrub and Betadine solution. Sterile drapes were applied.  I then injected the area to be incised with Marcaine with epinephrine solution. I then used a scalpel to make a linear midline incision over the L3-4 intervertebral disc space. I then used electrocautery to perform a left sided subperiosteal dissection exposing the spinous process and lamina of L3 and L4. We obtained intraoperative radiograph to confirm our location. I then inserted the [facility] retractor for exposure.  We then brought the operative microscope into the field. Under its magnification and illumination we completed the microdissection. I used a high-speed  drill to perform a laminotomy at L3. I then used a Kerrison punches to widen the laminotomy and removed the ligamentum flavum at L3-4 and remove the cephalad aspect of the L4 lamina. We then used microdissection to free up the thecal sac and the L3 and L4 nerve root from the epidural tissue. The synovial cyst was somewhat adherent to the dura. We used microdissection to free up the dura from the synovial cyst. I then used a Kerrison punch to perform a foraminotomy at about the left L3 and left L4 nerve root and to remove the synovial cyst.I inspected the left L3-4 intervertebral disc. There were no significant herniations.   I then palpated along the ventral surface of the thecal sac and along exit route of the left L3 and left L4 nerve root and noted that the neural structures were well decompressed. This completed the decompression.  We then obtained hemostasis using bipolar electrocautery. We irrigated the wound out with bacitracin solution. We then removed the retractor. We then reapproximated the patient's thoracolumbar fascia with interrupted #1 Vicryl suture. We then reapproximated the patient's subcutaneous tissue with interrupted 2-0 Vicryl suture. We then reapproximated patient's skin with Steri-Strips and benzoin. The was then coated with bacitracin ointment. The drapes were removed. The patient was subsequently returned to the supine position where they were extubated by the anesthesia team. The patient was then transported to the postanesthesia care unit in stable condition. All sponge instrument and needle counts were reportedly correct at the end of this case.

## 2014-03-24 MED ORDER — OXYCODONE-ACETAMINOPHEN 5-325 MG PO TABS: 1.0000 | ORAL_TABLET | ORAL | Status: DC | PRN

## 2014-03-24 MED ORDER — DOCUSATE SODIUM 100 MG PO CAPS: 100.0000 mg | ORAL_CAPSULE | Freq: Two times a day (BID) | ORAL | Status: AC

## 2014-03-24 MED ORDER — DIAZEPAM 5 MG PO TABS
5.0000 mg | ORAL_TABLET | Freq: Four times a day (QID) | ORAL | Status: DC | PRN
Start: 2014-03-24 — End: 2017-12-09

## 2014-03-24 NOTE — Discharge Summary (Signed)
Physician Discharge Summary  Patient ID: Sean Kidd MRN: [id number] DOB/AGE: April 29, 1945 69 y.o.  Admit date: 03/23/2014 Discharge date: 03/24/2014  Admission Diagnoses: Left L3-4 synovial cyst, lumbago, lumbar radiculopathy  Discharge Diagnoses: The same Active Problems:   Synovial cyst of lumbar facet joint   Discharged Condition: good  Hospital Course: I performed a left L3-4 laminectomy with resection of synovial cyst to decompress the left L3 and L4 nerve roots using microdissection on 03/23/2014. The surgery went well.  The patient's postoperative course was unremarkable. On postoperative day #1 he requested discharge to home. The patient, and his wife, were given oral and written discharge instructions. All their questions were answered.  Consults: None Significant Diagnostic Studies: None Treatments: Left L3-4 laminectomy with resection of synovial cyst to decompress the left L3 and left L4 nerve roots using microdissection Discharge Exam: Blood pressure 111/61, pulse 85, temperature 98.2 F (36.8 C), temperature source Oral, resp. rate 18, height 6\' 1"  (1.854 m), weight 109.77 kg (242 lb), SpO2 95 %. The patient is alert and pleasant. His strength is grossly normal his lower extremities. He looks well.  Disposition: Home  Discharge Instructions    Call MD for:  difficulty breathing, headache or visual disturbances    Complete by:  As directed      Call MD for:  extreme fatigue    Complete by:  As directed      Call MD for:  hives    Complete by:  As directed      Call MD for:  persistant dizziness or light-headedness    Complete by:  As directed      Call MD for:  persistant nausea and vomiting    Complete by:  As directed      Call MD for:  redness, tenderness, or signs of infection (pain, swelling, redness, odor or green/yellow discharge around incision site)    Complete by:  As directed      Call MD for:  severe uncontrolled pain    Complete by:  As directed      Call MD for:  temperature >100.4    Complete by:  As directed      Diet - low sodium heart healthy    Complete by:  As directed      Discharge instructions    Complete by:  As directed   Call [phone] for a followup appointment. Take a stool softener while you are using pain medications.     Driving Restrictions    Complete by:  As directed   Do not drive for 2 weeks.     Increase activity slowly    Complete by:  As directed      Lifting restrictions    Complete by:  As directed   Do not lift more than 5 pounds. No excessive bending or twisting.     May shower / Bathe    Complete by:  As directed   He may shower after the pain she is removed 3 days after surgery. Leave the incision alone.     Remove dressing in 48 hours    Complete by:  As directed   Your stitches are under the scan and will dissolve by themselves. The Steri-Strips will fall off after you take a few showers. Do not rub back or pick at the wound, Leave the wound alone.            Medication List    TAKE these medications  diazepam 5 MG tablet  Commonly known as:  VALIUM  Take 1 tablet (5 mg total) by mouth every 6 (six) hours as needed for muscle spasms.     docusate sodium 100 MG capsule  Commonly known as:  COLACE  Take 1 capsule (100 mg total) by mouth 2 (two) times daily.     lisinopril-hydrochlorothiazide 20-12.5 MG per tablet  Commonly known as:  PRINZIDE,ZESTORETIC  Take 1 tablet by mouth daily.     oxyCODONE-acetaminophen 5-325 MG per tablet  Commonly known as:  PERCOCET/ROXICET  Take 1-2 tablets by mouth every 4 (four) hours as needed for moderate pain.         SignedOphelia Charter 03/24/2014, 7:17 AM

## 2014-03-24 NOTE — Care Management Note (Signed)
    Page 1 of 1   03/24/2014     11:05:42 AM CARE MANAGEMENT NOTE 03/24/2014  Patient:  RAMIL, EDGINGTON   Account Number:  [id number]  Date Initiated:  03/24/2014  Documentation initiated by:  Lorne Skeens  Subjective/Objective Assessment:   Patient was admitted for - Left L34 Laminectomy and foraminotomy for synovial cyst.  Lives at home with spouse.     Action/Plan:   Will follow for discharge needs pending PT/OT evals and physician orders.   Anticipated DC Date:  03/24/2014   Anticipated DC Plan:  HOME/SELF CARE         Choice offered to / List presented to:             Status of service:  Completed, signed off Medicare Important Message given?  NA - LOS <3 / Initial given by admissions (If response is "NO", the following Medicare IM given date fields will be blank) Date Medicare IM given:   Medicare IM given by:   Date Additional Medicare IM given:   Additional Medicare IM given by:    Discharge Disposition:  HOME/SELF CARE  Per UR Regulation:  Reviewed for med. necessity/level of care/duration of stay  If discussed at [place] of Stay Meetings, dates discussed:    Comments:

## 2014-03-24 NOTE — Progress Notes (Signed)
UR complete.  Crayton Savarese RN, MSN 

## 2014-03-24 NOTE — Progress Notes (Signed)
Discharge orders received. Pt for discharge home today. IV d/c'd. Dressing clean, dry, intact to lower back. Pt given discharge instructions and prescriptions with verbalized understanding. Family in room to assist with discharge. Staff brought pt downstairs via wheelchair.

## 2014-03-25 ENCOUNTER — Encounter (HOSPITAL_COMMUNITY): Payer: Self-pay | Admitting: Neurosurgery

## 2014-05-27 NOTE — H&P (Signed)
PATIENT NAME:  Sean Kidd, Sean Kidd MR#:  [id number] DATE OF BIRTH:  1945/09/02  DATE OF ADMISSION:  06/24/2013  REFERRING PHYSICIAN: Briant Sites. Joni Fears, MD  PRIMARY CARE PHYSICIAN: Rusty Aus, MD, [facility].   CHIEF COMPLAINT: Vision changes   HISTORY OF PRESENT ILLNESS: This is a 69 year old Caucasian male with history of hypertension presenting with vision changes. Notes acute onset vision changes occurring this morning prior to arrival, greater than 7 hours, originally described as blurred vision when having both eyes open, as well as double vision. He also notes had a recent headache of 2 days' duration,  frontal bilateral, described as "pain" 4 to 5 out of 10 in intensity. No worsening or relieving factors. No radiation. No association with bright lights or loud noises. No further associated symptoms including paralysis, paresthesias, weakness. Symptoms remain. Headache is still around 4 to 5 out of 10 in intensity. He still has blurred vision on forward and rightward gaze.  REVIEW OF SYSTEMS:    CONSTITUTIONAL: Denies fever, fatigue, weakness.  EYES: Positive for blurred vision and double vision. Denies any eye pain or redness.  EARS, NOSE, THROAT: Denies tinnitus, ear pain, hearing loss.  RESPIRATORY: Denies cough, wheeze, shortness of breath.  CARDIOVASCULAR: Denies chest pain, palpitations, edema.  GASTROINTESTINAL: Denies nausea, vomiting, diarrhea, abdominal pain. GENITOURINARY: Denies dysuria or hematuria.  ENDOCRINE: Denies nocturia or thyroid problems. HEMATOLOGIC AND LYMPHATIC: Denies easy bruising or bleeding. SKIN: Denies rashes or lesions.  MUSCULOSKELETAL: Denies pain in the neck, back, shoulders, knees, hips, or arthritic symptoms. NEUROLOGIC: Denies paralysis or paresthesias. PSYCHIATRIC: Denies anxiety or depressive symptoms.   Otherwise, full review of systems performed by me is negative.   PAST MEDICAL HISTORY: Hypertension.   SOCIAL HISTORY: Denies any  tobacco use. Positive for occasional alcohol usage.   FAMILY HISTORY: Positive for lung cancer. No cardiovascular illnesses. No history of strokes.   ALLERGIES: No known drug allergies.   HOME MEDICATIONS: Lisinopril, however, unknown dosage.   PHYSICAL EXAMINATION: VITAL SIGNS: Temperature 98.2, heart rate 75, respirations 16, blood pressure 154/87, saturating 97% on room air. Weight 111.1 kg, BMI 32.3.  GENERAL: Well-nourished, well-developed Caucasian gentleman, currently in no acute distress.  HEAD: Normocephalic, atraumatic.  EYES: Pupils equal, round, and reactive to light; extraocular muscles intact; no scleral icterus; 2 to 3 beats of nystagmus on leftward gaze. Visual fields intact; however, the patient appreciates double vision on extreme rightward gaze.  MOUTH: Moist mucous membranes. Dentition intact. No abscess noted.  EARS, NOSE, THROAT: Clear without exudates. No external lesions.  NECK: Supple. No thyromegaly. No nodules. No JVD.  PULMONARY: Clear to auscultation bilaterally without wheezes, rales, or rhonchi. No use of accessory muscles. Good respiratory effort. Chest nontender to palpation.  CARDIOVASCULAR: S1, S2, regular rate and rhythm. No murmur, rubs, or gallops. No edema. Pedal pulses 2+ bilaterally.  GASTROINTESTINAL: Soft, nontender, nondistended. No masses. Positive bowel sounds. No hepatosplenomegaly.  MUSCULOSKELETAL: No swelling, clubbing, or edema. Range of motion full in all extremities.  NEUROLOGIC: Cranial nerves II through XII intact. Again, worsened diplopia on extreme rightward gaze, however, no appreciable deficits of extraocular muscles. Sensation intact. Reflexes intact. Pronator drift within normal limits. Strength 5 out of 5 in all extremities, including proximal and distal, flexor and extension.  SKIN: No ulcerations, lesions, rash, or cyanosis. Skin warm and dry. Turgor intact.  PSYCHIATRIC: Mood and affect within normal limits. The patient is awake,  alert, oriented x 3. Insight and judgment intact.   LABORATORY, DIAGNOSTIC, AND RADIOLOGICAL DATA:  Sodium 141, potassium 3.6, chloride 104, bicarbonate 27, BUN 19, creatinine 0.99, glucose 99. Troponin less than 0.02. WBC 9.1, hemoglobin 14.3, platelets of 196.   EKG performed revealed no acute ST or T wave abnormalities.   CT head performed: Age-appropriate bifrontal atrophy, no acute intracranial process.   ASSESSMENT AND PLAN: A 69 year old gentleman with history of hypertension presenting with acute onset vision changes. 1.  Diplopia with possible cerebrovascular accident. Hiss double vision is with rightward gaze or walking forward. He has received aspirin therapy. Will continue aspirin and statin therapy. Will perform neuro checks. Get an MRI and carotid Doppler and place on telemetry. Transthoracic echocardiogram as well as lipids in searching for further etiologies and risk factor modification. Of note once again, can worsen diplopia on rightward gaze, though no notable deficit in extraocular movements. If cerebrovascular accident seems to be negative, will need close followup with ophthalmology 2.  Hypertension. Allow permissive hypertension in setting of possible stroke, treating only if blood pressure greater than 220/120 or if symptomatic. At that time, hydralazine 10 mg IV.  3.  Venous thromboembolism prophylaxis with sequential compression devices.   CODE STATUS: The patient is full code.   TIME SPENT: 45 minutes.    ____________________________ Aaron Mose. Hower, MD dkh:jcm D: 06/24/2013 20:44:35 ET T: 06/24/2013 21:24:05 ET JOB#: [id number]  cc: Aaron Mose. Hower, MD, <Dictator> DAVID Woodfin Ganja MD ELECTRONICALLY SIGNED 06/25/2013 4:17

## 2014-05-27 NOTE — Discharge Summary (Signed)
PATIENT NAME:  Sean Kidd, Sean Kidd MR#:  [id number] DATE OF BIRTH:  10/24/1945  DATE OF ADMISSION:  06/24/2013 DATE OF DISCHARGE:  06/26/2013  DIAGNOSES AT TIME OF DISCHARGE:  1. Diplopia with headache, unclear etiology.  2. Hypertension.   CHIEF COMPLAINT: Visual disturbance.   HISTORY OF PRESENT ILLNESS: The patient is a 69 year old male with a history of hypertension, who presented to the ED complaining of double vision. The patient also had been having a frontal headache for about 2 days. He described a pain around 4 to 5 out of 10 in intensity. The patient denied any chest pain, fevers, or chills.  There is no history of head injuries or falls. The patient describes double vision more with reading and denies any dizziness. The patient denied any focal weakness in his arms and legs.   PHYSICAL EXAMINATION:  VITAL SIGNS: Temperature 98.2, heart rate 75, respirations 16, blood pressure 150/87, oxygen saturation 97% on room air.  GENERAL: He was not in distress.  HEENT:  [place]/AT.  Pupils are equal and reactive to light.  HEART: S1, S2.  LUNGS: Clear.  ABDOMEN: Soft, nontender. EXTREMITIES: No edema.   HOSPITAL COURSE: The patient was admitted to [facility] for possible TIA. His troponin was negative. Sedimentation rate was 10, hemoglobin 14.3, hematocrit 42.4, glucose 99, creatinine 0.99. Electrolytes were normal. Ultrasound of the carotids did not show any significant T1 hemodynamically significant stenosis. MRI of the brain did not show any acute intracranial abnormality. CT of the brain was also negative other than mild age-appropriate bifrontal atrophy. Echocardiogram showed LVEF 55% to 60%, normal, and global left ventricular systolic function, mildly dilated left atrium. During his stay in the hospital, the patient was also seen by ophthalmologist, Dr. Wallace Going.  A sedimentation rate was ordered.  It was negative at 10. The patient was advised to follow up with ophthalmologist, Dr. Dawna Part.  Anticholinesterase antibody test was sent off. Results were still pending. He was started on aspirin as well as atorvastatin and advised to continue his home dose of lisinopril.   DISCHARGE MEDICATIONS: The patient was discharged in stable condition with the following medications:  Tramadol 50 mg p.o. t.i.d. p.r.n. for headache, aspirin 325 mg delayed release 1 tablet once a day, atorvastatin 20 mg once a day, lisinopril as at home.  FOLLOWUP:  Advised to keep her followup appointment with ophthalmologist, Dr. Dawna Part.  Also, follow up with neurologist as an outpatient and follow up with Dr. Sabra Heck as well as an outpatient. The patient has been advised to call Dr. Ammie Ferrier office with any questions or concerns.   TOTAL TIME SPENT IN DISCHARGING THE PATIENT: 35 minutes.    ____________________________ Tracie Harrier, MD vh:dd D: 06/26/2013 14:25:30 ET T: 06/26/2013 21:44:48 ET JOB#: [id number]  cc: Tracie Harrier, MD, <Dictator> Tracie Harrier MD ELECTRONICALLY SIGNED 07/12/2013 13:30

## 2017-12-08 ENCOUNTER — Encounter: Payer: Self-pay | Admitting: *Deleted

## 2017-12-09 ENCOUNTER — Ambulatory Visit: Payer: Medicare Other | Admitting: Anesthesiology

## 2017-12-09 ENCOUNTER — Ambulatory Visit
Admission: RE | Admit: 2017-12-09 | Discharge: 2017-12-09 | Disposition: A | Discharge status: Home / Self Care | Payer: Medicare Other | Source: Ambulatory Visit | Attending: Internal Medicine | Admitting: Internal Medicine

## 2017-12-09 ENCOUNTER — Encounter
Admission: RE | Disposition: A | Discharge status: Home / Self Care | Payer: Self-pay | Source: Ambulatory Visit | Attending: Internal Medicine

## 2017-12-09 ENCOUNTER — Encounter: Payer: Self-pay | Admitting: *Deleted

## 2017-12-09 DIAGNOSIS — K640 First degree hemorrhoids: Secondary | ICD-10-CM | POA: Diagnosis not present

## 2017-12-09 DIAGNOSIS — K228 Other specified diseases of esophagus: Secondary | ICD-10-CM | POA: Insufficient documentation

## 2017-12-09 DIAGNOSIS — K449 Diaphragmatic hernia without obstruction or gangrene: Secondary | ICD-10-CM | POA: Diagnosis not present

## 2017-12-09 DIAGNOSIS — Z1211 Encounter for screening for malignant neoplasm of colon: Secondary | ICD-10-CM | POA: Diagnosis not present

## 2017-12-09 DIAGNOSIS — K2970 Gastritis, unspecified, without bleeding: Secondary | ICD-10-CM | POA: Insufficient documentation

## 2017-12-09 DIAGNOSIS — R1310 Dysphagia, unspecified: Secondary | ICD-10-CM | POA: Diagnosis present

## 2017-12-09 DIAGNOSIS — I10 Essential (primary) hypertension: Secondary | ICD-10-CM | POA: Diagnosis not present

## 2017-12-09 DIAGNOSIS — Z86010 Personal history of colonic polyps: Secondary | ICD-10-CM | POA: Insufficient documentation

## 2017-12-09 DIAGNOSIS — Z79899 Other long term (current) drug therapy: Secondary | ICD-10-CM | POA: Diagnosis not present

## 2017-12-09 DIAGNOSIS — K222 Esophageal obstruction: Secondary | ICD-10-CM | POA: Insufficient documentation

## 2017-12-09 DIAGNOSIS — K5730 Diverticulosis of large intestine without perforation or abscess without bleeding: Secondary | ICD-10-CM | POA: Diagnosis not present

## 2017-12-09 DIAGNOSIS — Z87442 Personal history of urinary calculi: Secondary | ICD-10-CM | POA: Diagnosis not present

## 2017-12-09 DIAGNOSIS — J45909 Unspecified asthma, uncomplicated: Secondary | ICD-10-CM | POA: Insufficient documentation

## 2017-12-09 DIAGNOSIS — K210 Gastro-esophageal reflux disease with esophagitis: Secondary | ICD-10-CM | POA: Insufficient documentation

## 2017-12-09 HISTORY — PX: ESOPHAGOGASTRODUODENOSCOPY (EGD) WITH PROPOFOL: SHX5813

## 2017-12-09 HISTORY — PX: COLONOSCOPY WITH PROPOFOL: SHX5780

## 2017-12-09 SURGERY — ESOPHAGOGASTRODUODENOSCOPY (EGD) WITH PROPOFOL
Anesthesia: General

## 2017-12-09 MED ORDER — PROPOFOL 10 MG/ML IV BOLUS
INTRAVENOUS | Status: DC | PRN
  Administered 2017-12-09: 80 mg via INTRAVENOUS

## 2017-12-09 MED ORDER — PROPOFOL 500 MG/50ML IV EMUL
INTRAVENOUS | Status: DC | PRN
  Administered 2017-12-09: 150 ug/kg/min via INTRAVENOUS

## 2017-12-09 MED ORDER — PROPOFOL 500 MG/50ML IV EMUL
INTRAVENOUS | Status: AC
  Filled 2017-12-09: qty 50

## 2017-12-09 MED ORDER — SODIUM CHLORIDE 0.9 % IV SOLN
INTRAVENOUS | Status: DC
  Administered 2017-12-09: 1000 mL via INTRAVENOUS

## 2017-12-09 MED ORDER — LIDOCAINE HCL (PF) 2 % IJ SOLN
INTRAMUSCULAR | Status: DC | PRN
  Administered 2017-12-09: 100 mg via INTRADERMAL

## 2017-12-09 NOTE — Interval H&P Note (Signed)
History and Physical Interval Note:  12/09/2017 8:03 AM  Sean Kidd  has presented today for surgery, with the diagnosis of P/H COLON POLYPS; DYSPHAGIA  The various methods of treatment have been discussed with the patient and family. After consideration of risks, benefits and other options for treatment, the patient has consented to  Procedure(s): ESOPHAGOGASTRODUODENOSCOPY (EGD) WITH PROPOFOL (N/A) COLONOSCOPY WITH PROPOFOL (N/A) as a surgical intervention .  The patient's history has been reviewed, patient examined, no change in status, stable for surgery.  I have reviewed the patient's chart and labs.  Questions were answered to the patient's satisfaction.     [name], [name]

## 2017-12-09 NOTE — H&P (Signed)
Outpatient short stay form Pre-procedure 12/09/2017 8:02 AM  K. Alice Reichert, M.D.  Primary Physician: Emily Filbert, MD  Reason for visit: Intermittent solid and liquid food dysphagia, personal history of colon polyps  History of present illness: Pleasant 72 year old male with a history of colon polyps presents for colon polyp surveillance with colonoscopy.  He has had a 1 year complaint of intermittent solid and liquid dysphagia.  No hematemesis or melena.  Has normal appetite and weight has been stable.  Last colonoscopy 2014 at reportedly showed no polyps.  Patient describes dysphagia as "hanging up" around the base of the throat as well as near the area of the thyroid in the neck.    Current Facility-Administered Medications:  .  0.9 %  sodium chloride infusion, , Intravenous, Continuous, [place], Benay Pike, MD, Last Rate: 20 mL/hr at 12/09/17 0734, 1,000 mL at 12/09/17 0734  Medications Prior to Admission  Medication Sig Dispense Refill Last Dose  . lisinopril-hydrochlorothiazide (PRINZIDE,ZESTORETIC) 20-12.5 MG per tablet Take 1 tablet by mouth daily.   12/08/2017 at Unknown time  . docusate sodium (COLACE) 100 MG capsule Take 1 capsule (100 mg total) by mouth 2 (two) times daily. 60 capsule 0      No Known Allergies   Past Medical History:  Diagnosis Date  . Asthma    as a child only  . Hypertension   . Kidney stones   . Pneumonia   . Wears glasses     Review of systems:  Otherwise negative.    Physical Exam  Gen: Alert, oriented. Appears stated age.  HEENT: [place]/AT. PERRLA. Lungs: CTA, no wheezes. CV: RR nl S1, S2. Abd: soft, benign, no masses. BS+ Ext: No edema. Pulses 2+    Planned procedures: Proceed with EGD and colonoscopy. The patient understands the nature of the planned procedure, indications, risks, alternatives and potential complications including but not limited to bleeding, infection, perforation, damage to internal organs and possible  oversedation/side effects from anesthesia. The patient agrees and gives consent to proceed.  Please refer to procedure notes for findings, recommendations and patient disposition/instructions.      K. Alice Reichert, M.D. Gastroenterology 12/09/2017  8:02 AM

## 2017-12-09 NOTE — Transfer of Care (Signed)
Immediate Anesthesia Transfer of Care Note  Patient: Sean Kidd  Procedure(s) Performed: ESOPHAGOGASTRODUODENOSCOPY (EGD) WITH PROPOFOL (N/A ) COLONOSCOPY WITH PROPOFOL (N/A )  Patient Location: PACU  Anesthesia Type:General  Level of Consciousness: awake and sedated  Airway & Oxygen Therapy: Patient Spontanous Breathing and Patient connected to nasal cannula oxygen  Post-op Assessment: Report given to RN and Post -op Vital signs reviewed and stable  Post vital signs: Reviewed and stable  Last Vitals:  Vitals Value Taken Time  BP 117/80 12/09/2017  8:31 AM  Temp 36.2 C 12/09/2017  8:31 AM  Pulse 67 12/09/2017  8:39 AM  Resp 14 12/09/2017  8:39 AM  SpO2 95 % 12/09/2017  8:39 AM  Vitals shown include unvalidated device data.  Last Pain:  Vitals:   12/09/17 0831  TempSrc: Tympanic  PainSc:          Complications: No apparent anesthesia complications

## 2017-12-09 NOTE — Op Note (Signed)
[facility] Gastroenterology Patient Name: Sean Kidd Procedure Date: 12/09/2017 7:35 AM MRN: [id number] Account #: [id number] Date of Birth: 06-10-1945 Admit Type: Outpatient Age: 72 Room: [facility] ENDO ROOM 4 Gender: Male Note Status: Finalized Procedure:            Upper GI endoscopy Indications:          Dysphagia Providers:            Benay Pike. Alice Reichert MD, MD Referring MD:         Rusty Aus, MD (Referring MD) Medicines:            Propofol per Anesthesia Complications:        No immediate complications. Procedure:            Pre-Anesthesia Assessment:                       - The risks and benefits of the procedure and the                        sedation options and risks were discussed with the                        patient. All questions were answered and informed                        consent was obtained.                       - Patient identification and proposed procedure were                        verified prior to the procedure by the nurse. The                        procedure was verified in the procedure room.                       - ASA Grade Assessment: III - A patient with severe                        systemic disease.                       - After reviewing the risks and benefits, the patient                        was deemed in satisfactory condition to undergo the                        procedure.                       After obtaining informed consent, the endoscope was                        passed under direct vision. Throughout the procedure,                        the patient's blood pressure, pulse, and oxygen  saturations were monitored continuously. The Endoscope                        was introduced through the mouth, and advanced to the                        third part of duodenum. The upper GI endoscopy was                        accomplished without difficulty. The patient tolerated     the procedure well. Findings:      LA Grade A (one or more mucosal breaks less than 5 mm, not extending       between tops of 2 mucosal folds) esophagitis with no bleeding was found       at the gastroesophageal junction.      The Z-line was irregular and was found at the gastroesophageal junction.       Mucosa was biopsied with a cold forceps for histology. One specimen       bottle was sent to pathology.      One benign-appearing, intrinsic mild (non-circumferential scarring)       stenosis was found at the gastroesophageal junction. This stenosis       measured 1.5 cm (inner diameter) x less than one cm (in length). The       stenosis was traversed. The scope was withdrawn. Dilation was performed       with a Maloney dilator with no resistance at 75 Fr.      A 3 cm hiatal hernia was present.      Patchy moderate inflammation characterized by congestion (edema),       erosions and erythema was found in the gastric antrum.      The examined duodenum was normal.      The exam was otherwise without abnormality. Impression:           - LA Grade A reflux esophagitis.                       - Z-line irregular, at the gastroesophageal junction.                        Biopsied.                       - Benign-appearing esophageal stenosis. Dilated.                       - 3 cm hiatal hernia.                       - Gastritis.                       - Normal examined duodenum.                       - The examination was otherwise normal. Recommendation:       - Await pathology results.                       - Proceed with colonoscopy                       - Monitor results to  esophageal dilation Procedure Code(s):    --- Professional ---                       [phone], Esophagogastroduodenoscopy, flexible, transoral;                        with biopsy, single or multiple                       43450, Dilation of esophagus, by unguided sound or                        bougie, single or multiple  passes Diagnosis Code(s):    --- Professional ---                       R13.10, Dysphagia, unspecified                       K29.70, Gastritis, unspecified, without bleeding                       K44.9, Diaphragmatic hernia without obstruction or                        gangrene                       K22.2, Esophageal obstruction                       K22.8, Other specified diseases of esophagus                       K21.0, Gastro-esophageal reflux disease with esophagitis CPT copyright 2018 American Medical Association. All rights reserved. The codes documented in this report are preliminary and upon coder review may  be revised to meet current compliance requirements. Efrain Sella MD, MD 12/09/2017 8:16:53 AM This report has been signed electronically. Number of Addenda: 0 Note Initiated On: 12/09/2017 7:35 AM      [facility]

## 2017-12-09 NOTE — Anesthesia Postprocedure Evaluation (Signed)
Anesthesia Post Note  Patient: NIHAL MARZELLA  Procedure(s) Performed: ESOPHAGOGASTRODUODENOSCOPY (EGD) WITH PROPOFOL (N/A ) COLONOSCOPY WITH PROPOFOL (N/A )  Patient location during evaluation: PACU Anesthesia Type: General Level of consciousness: awake and alert Pain management: pain level controlled Vital Signs Assessment: post-procedure vital signs reviewed and stable Respiratory status: spontaneous breathing, nonlabored ventilation, respiratory function stable and patient connected to nasal cannula oxygen Cardiovascular status: blood pressure returned to baseline and stable Postop Assessment: no apparent nausea or vomiting Anesthetic complications: no     Last Vitals:  Vitals:   12/09/17 0720 12/09/17 0831  BP: (!) 133/99 117/80  Pulse: 78   Resp: 16   Temp: (!) 36.3 C (!) 36.2 C  SpO2: 100%     Last Pain:  Vitals:   12/09/17 0831  TempSrc: Tympanic  PainSc: 0-No pain                 Molli Barrows

## 2017-12-09 NOTE — Anesthesia Preprocedure Evaluation (Signed)
Anesthesia Evaluation  Patient identified by MRN, date of birth, ID band Patient awake    Reviewed: Allergy & Precautions, H&P , NPO status , Patient's Chart, lab work & pertinent test results, reviewed documented beta blocker date and time   Airway Mallampati: II   Neck ROM: full    Dental  (+) Poor Dentition, Teeth Intact   Pulmonary neg shortness of breath, asthma , pneumonia, resolved,    Pulmonary exam normal        Cardiovascular Exercise Tolerance: Good hypertension, On Medications negative cardio ROS Normal cardiovascular exam Rhythm:regular Rate:Normal     Neuro/Psych negative neurological ROS  negative psych ROS   GI/Hepatic negative GI ROS, Neg liver ROS,   Endo/Other  negative endocrine ROS  Renal/GU Renal disease  negative genitourinary   Musculoskeletal   Abdominal   Peds  Hematology negative hematology ROS (+)   Anesthesia Other Findings Past Medical History: No date: Asthma     Comment:  as a child only No date: Hypertension No date: Kidney stones No date: Pneumonia No date: Wears glasses Past Surgical History: No date: COLONOSCOPY W/ BIOPSIES AND POLYPECTOMY No date: LITHOTRIPSY     Comment:  for kidney stones 03/23/2014: LUMBAR LAMINECTOMY/DECOMPRESSION MICRODISCECTOMY; Left     Comment:  Procedure: LUMBAR LAMINECTOMY/DECOMPRESSION               MICRODISCECTOMY 1 LEVEL;  Surgeon: Newman Pies, MD;                Location: [facility];  Service: Neurosurgery;                Laterality: Left;  Left L34 Laminectomy and foraminotomy               for synovial cyst No date: ROTATOR CUFF REPAIR     Comment:  x 3 No date: TONSILLECTOMY   Reproductive/Obstetrics negative OB ROS                             Anesthesia Physical Anesthesia Plan  ASA: III  Anesthesia Plan: General   Post-op Pain Management:    Induction:   PONV Risk Score and Plan:   Airway  Management Planned:   Additional Equipment:   Intra-op Plan:   Post-operative Plan:   Informed Consent: I have reviewed the patients History and Physical, chart, labs and discussed the procedure including the risks, benefits and alternatives for the proposed anesthesia with the patient or authorized representative who has indicated his/her understanding and acceptance.   Dental Advisory Given  Plan Discussed with: CRNA  Anesthesia Plan Comments:         Anesthesia Quick Evaluation

## 2017-12-09 NOTE — Anesthesia Post-op Follow-up Note (Signed)
Anesthesia QCDR form completed.        

## 2017-12-09 NOTE — Op Note (Signed)
[facility] Gastroenterology Patient Name: Sean Kidd Procedure Date: 12/09/2017 7:33 AM MRN: [id number] Account #: [id number] Date of Birth: [birth year] Admit Type: Outpatient Age: 72 Room: [facility] ENDO ROOM 4 Gender: Male Note Status: Finalized Procedure:            Colonoscopy Indications:          High risk colon cancer surveillance: Personal history                        of colonic polyps Providers:            Benay Pike. Toledo MD, MD Medicines:            Propofol per Anesthesia Complications:        No immediate complications. Procedure:            Pre-Anesthesia Assessment:                       - The risks and benefits of the procedure and the                        sedation options and risks were discussed with the                        patient. All questions were answered and informed                        consent was obtained.                       - Patient identification and proposed procedure were                        verified prior to the procedure by the nurse. The                        procedure was verified in the procedure room.                       - ASA Grade Assessment: III - A patient with severe                        systemic disease.                       - After reviewing the risks and benefits, the patient                        was deemed in satisfactory condition to undergo the                        procedure.                       After obtaining informed consent, the colonoscope was                        passed under direct vision. Throughout the procedure,                        the patient's blood pressure, pulse, and oxygen  saturations were monitored continuously. The                        Colonoscope was introduced through the anus and                        advanced to the the cecum, identified by appendiceal                        orifice and ileocecal valve. The colonoscopy was         performed without difficulty. The patient tolerated the                        procedure well. The quality of the bowel preparation                        was excellent. The ileocecal valve, appendiceal                        orifice, and rectum were photographed. Findings:      The perianal and digital rectal examinations were normal. Pertinent       negatives include normal sphincter tone and no palpable rectal lesions.      Many small and large-mouthed diverticula were found in the sigmoid       colon. There was no evidence of diverticular bleeding.      Non-bleeding internal hemorrhoids were found during retroflexion. The       hemorrhoids were Grade I (internal hemorrhoids that do not prolapse).      The exam was otherwise without abnormality. Impression:           - Mild diverticulosis in the sigmoid colon. There was                        no evidence of diverticular bleeding.                       - Non-bleeding internal hemorrhoids.                       - The examination was otherwise normal.                       - No specimens collected. Recommendation:       - Patient has a contact number available for                        emergencies. The signs and symptoms of potential                        delayed complications were discussed with the patient.                        Return to normal activities tomorrow. Written discharge                        instructions were provided to the patient.                       - Resume previous diet.                       -  Continue present medications.                       - Monitor results to esophageal dilation                       - No repeat colonoscopy due to current age (70 years or                        older) and the absence of advanced adenomas.                       - Return to physician assistant in 3 months.                       - The findings and recommendations were discussed with                        the patient  and their spouse. Procedure Code(s):    --- Professional ---                       W4132, Colorectal cancer screening; colonoscopy on                        individual at high risk Diagnosis Code(s):    --- Professional ---                       Z86.010, Personal history of colonic polyps                       K64.0, First degree hemorrhoids                       K57.30, Diverticulosis of large intestine without                        perforation or abscess without bleeding CPT copyright 2018 American Medical Association. All rights reserved. The codes documented in this report are preliminary and upon coder review may  be revised to meet current compliance requirements. Efrain Sella MD, MD 12/09/2017 8:29:59 AM This report has been signed electronically. Number of Addenda: 0 Note Initiated On: 12/09/2017 7:33 AM Scope Withdrawal Time: 0 hours 5 minutes 37 seconds  Total Procedure Duration: 0 hours 7 minutes 37 seconds       [facility]

## 2017-12-09 NOTE — Anesthesia Procedure Notes (Signed)
Date/Time: 12/09/2017 8:16 AM Performed by: Nelda Marseille, CRNA Pre-anesthesia Checklist: Patient identified, Emergency Drugs available, Suction available, Patient being monitored and Timeout performed Oxygen Delivery Method: Nasal cannula

## 2017-12-10 ENCOUNTER — Encounter: Payer: Self-pay | Admitting: Internal Medicine

## 2017-12-10 LAB — SURGICAL PATHOLOGY

## 2018-03-11 DIAGNOSIS — R1310 Dysphagia, unspecified: Secondary | ICD-10-CM | POA: Diagnosis not present

## 2018-03-11 DIAGNOSIS — K219 Gastro-esophageal reflux disease without esophagitis: Secondary | ICD-10-CM | POA: Diagnosis not present

## 2018-04-22 DIAGNOSIS — H43313 Vitreous membranes and strands, bilateral: Secondary | ICD-10-CM | POA: Diagnosis not present

## 2018-05-12 ENCOUNTER — Encounter: Payer: Self-pay | Admitting: *Deleted

## 2018-06-30 ENCOUNTER — Other Ambulatory Visit: Payer: Self-pay | Admitting: *Deleted

## 2018-06-30 NOTE — Patient Outreach (Signed)
[facility]) Care Management  06/30/2018  Sean Kidd 03/22/1945 [id number]   Telephone assessment complete according to health risk assessment through [facility].  No needs identified, [facility] information letter, pamphlet, and magnet sent to member.  Will follow up within the next 6 months.  Valente David, [place], MSN [place] [phone]

## 2018-07-05 DIAGNOSIS — Z125 Encounter for screening for malignant neoplasm of prostate: Secondary | ICD-10-CM | POA: Diagnosis not present

## 2018-07-05 DIAGNOSIS — Z79899 Other long term (current) drug therapy: Secondary | ICD-10-CM | POA: Diagnosis not present

## 2018-07-12 DIAGNOSIS — Z79899 Other long term (current) drug therapy: Secondary | ICD-10-CM | POA: Diagnosis not present

## 2018-07-12 DIAGNOSIS — Z0000 Encounter for general adult medical examination without abnormal findings: Secondary | ICD-10-CM | POA: Diagnosis not present

## 2018-07-12 DIAGNOSIS — K210 Gastro-esophageal reflux disease with esophagitis: Secondary | ICD-10-CM | POA: Diagnosis not present

## 2018-07-12 DIAGNOSIS — E538 Deficiency of other specified B group vitamins: Secondary | ICD-10-CM | POA: Diagnosis not present

## 2018-07-12 DIAGNOSIS — Z125 Encounter for screening for malignant neoplasm of prostate: Secondary | ICD-10-CM | POA: Diagnosis not present

## 2018-07-14 DIAGNOSIS — L570 Actinic keratosis: Secondary | ICD-10-CM | POA: Diagnosis not present

## 2018-07-14 DIAGNOSIS — D2261 Melanocytic nevi of right upper limb, including shoulder: Secondary | ICD-10-CM | POA: Diagnosis not present

## 2018-07-14 DIAGNOSIS — D225 Melanocytic nevi of trunk: Secondary | ICD-10-CM | POA: Diagnosis not present

## 2018-07-14 DIAGNOSIS — X32XXXA Exposure to sunlight, initial encounter: Secondary | ICD-10-CM | POA: Diagnosis not present

## 2018-07-14 DIAGNOSIS — D2271 Melanocytic nevi of right lower limb, including hip: Secondary | ICD-10-CM | POA: Diagnosis not present

## 2018-07-14 DIAGNOSIS — D2262 Melanocytic nevi of left upper limb, including shoulder: Secondary | ICD-10-CM | POA: Diagnosis not present

## 2018-07-14 DIAGNOSIS — L821 Other seborrheic keratosis: Secondary | ICD-10-CM | POA: Diagnosis not present

## 2018-07-14 DIAGNOSIS — D2272 Melanocytic nevi of left lower limb, including hip: Secondary | ICD-10-CM | POA: Diagnosis not present

## 2018-08-02 ENCOUNTER — Other Ambulatory Visit: Payer: Self-pay | Admitting: *Deleted

## 2018-08-02 NOTE — Patient Outreach (Signed)
[facility]) Care Management  08/02/2018  CEDAR DITULLIO 1945-03-13 [id number]   Member will be followed by external care management program.  Will close case at this time, will notify primary MD of transition to external program.  Valente David, RN, MSN [place] Manager [phone]

## 2018-09-22 DIAGNOSIS — K210 Gastro-esophageal reflux disease with esophagitis: Secondary | ICD-10-CM | POA: Diagnosis not present

## 2018-09-22 DIAGNOSIS — R6889 Other general symptoms and signs: Secondary | ICD-10-CM | POA: Diagnosis not present

## 2018-09-22 DIAGNOSIS — R490 Dysphonia: Secondary | ICD-10-CM | POA: Diagnosis not present

## 2018-10-01 DIAGNOSIS — R490 Dysphonia: Secondary | ICD-10-CM | POA: Diagnosis not present

## 2018-10-01 DIAGNOSIS — J301 Allergic rhinitis due to pollen: Secondary | ICD-10-CM | POA: Diagnosis not present

## 2018-11-12 ENCOUNTER — Ambulatory Visit: Payer: Medicare Other | Admitting: *Deleted

## 2019-04-28 DIAGNOSIS — H43313 Vitreous membranes and strands, bilateral: Secondary | ICD-10-CM | POA: Diagnosis not present

## 2019-06-09 DIAGNOSIS — L237 Allergic contact dermatitis due to plants, except food: Secondary | ICD-10-CM | POA: Diagnosis not present

## 2019-07-06 DIAGNOSIS — Z125 Encounter for screening for malignant neoplasm of prostate: Secondary | ICD-10-CM | POA: Diagnosis not present

## 2019-07-06 DIAGNOSIS — Z79899 Other long term (current) drug therapy: Secondary | ICD-10-CM | POA: Diagnosis not present

## 2019-07-06 DIAGNOSIS — E538 Deficiency of other specified B group vitamins: Secondary | ICD-10-CM | POA: Diagnosis not present

## 2019-07-13 DIAGNOSIS — Z79899 Other long term (current) drug therapy: Secondary | ICD-10-CM | POA: Diagnosis not present

## 2019-07-13 DIAGNOSIS — Z0000 Encounter for general adult medical examination without abnormal findings: Secondary | ICD-10-CM | POA: Diagnosis not present

## 2019-07-13 DIAGNOSIS — I10 Essential (primary) hypertension: Secondary | ICD-10-CM | POA: Diagnosis not present

## 2019-07-13 DIAGNOSIS — Z125 Encounter for screening for malignant neoplasm of prostate: Secondary | ICD-10-CM | POA: Diagnosis not present

## 2019-10-17 DIAGNOSIS — D2261 Melanocytic nevi of right upper limb, including shoulder: Secondary | ICD-10-CM | POA: Diagnosis not present

## 2019-10-17 DIAGNOSIS — L570 Actinic keratosis: Secondary | ICD-10-CM | POA: Diagnosis not present

## 2019-10-17 DIAGNOSIS — D225 Melanocytic nevi of trunk: Secondary | ICD-10-CM | POA: Diagnosis not present

## 2019-10-17 DIAGNOSIS — L821 Other seborrheic keratosis: Secondary | ICD-10-CM | POA: Diagnosis not present

## 2019-10-17 DIAGNOSIS — C44619 Basal cell carcinoma of skin of left upper limb, including shoulder: Secondary | ICD-10-CM | POA: Diagnosis not present

## 2019-10-17 DIAGNOSIS — X32XXXA Exposure to sunlight, initial encounter: Secondary | ICD-10-CM | POA: Diagnosis not present

## 2019-10-17 DIAGNOSIS — D2262 Melanocytic nevi of left upper limb, including shoulder: Secondary | ICD-10-CM | POA: Diagnosis not present

## 2019-10-17 DIAGNOSIS — D2272 Melanocytic nevi of left lower limb, including hip: Secondary | ICD-10-CM | POA: Diagnosis not present

## 2019-10-17 DIAGNOSIS — D485 Neoplasm of uncertain behavior of skin: Secondary | ICD-10-CM | POA: Diagnosis not present

## 2019-10-17 DIAGNOSIS — D2271 Melanocytic nevi of right lower limb, including hip: Secondary | ICD-10-CM | POA: Diagnosis not present

## 2019-10-31 DIAGNOSIS — C44629 Squamous cell carcinoma of skin of left upper limb, including shoulder: Secondary | ICD-10-CM | POA: Diagnosis not present

## 2019-10-31 DIAGNOSIS — C44619 Basal cell carcinoma of skin of left upper limb, including shoulder: Secondary | ICD-10-CM | POA: Diagnosis not present

## 2020-01-13 DIAGNOSIS — M25552 Pain in left hip: Secondary | ICD-10-CM | POA: Diagnosis not present

## 2020-01-13 DIAGNOSIS — M545 Low back pain, unspecified: Secondary | ICD-10-CM | POA: Diagnosis not present

## 2023-01-30 ENCOUNTER — Other Ambulatory Visit: Payer: Self-pay | Admitting: Internal Medicine

## 2023-01-30 DIAGNOSIS — R972 Elevated prostate specific antigen [PSA]: Secondary | ICD-10-CM

## 2023-02-03 ENCOUNTER — Ambulatory Visit
Admission: RE | Admit: 2023-02-03 | Discharge: 2023-02-03 | Disposition: A | Discharge status: Home / Self Care | Payer: Medicare Other | Source: Ambulatory Visit | Attending: Internal Medicine | Admitting: Internal Medicine

## 2023-02-03 DIAGNOSIS — R972 Elevated prostate specific antigen [PSA]: Secondary | ICD-10-CM | POA: Diagnosis present

## 2023-02-03 MED ORDER — GADOBUTROL 1 MMOL/ML IV SOLN
10.0000 mL | Freq: Once | INTRAVENOUS | Status: AC | PRN
  Administered 2023-02-03: 10 mL via INTRAVENOUS

## 2023-10-09 ENCOUNTER — Other Ambulatory Visit: Payer: Self-pay | Admitting: Otolaryngology

## 2023-10-09 DIAGNOSIS — D490 Neoplasm of unspecified behavior of digestive system: Secondary | ICD-10-CM

## 2023-10-12 ENCOUNTER — Encounter: Payer: Self-pay | Admitting: Otolaryngology

## 2023-10-14 ENCOUNTER — Ambulatory Visit
Admission: RE | Admit: 2023-10-14 | Discharge: 2023-10-14 | Disposition: A | Discharge status: Home / Self Care | Source: Ambulatory Visit | Attending: Otolaryngology | Admitting: Otolaryngology

## 2023-10-14 DIAGNOSIS — D490 Neoplasm of unspecified behavior of digestive system: Secondary | ICD-10-CM

## 2023-10-14 MED ORDER — IOPAMIDOL (ISOVUE-300) INJECTION 61%
75.0000 mL | Freq: Once | INTRAVENOUS | Status: AC | PRN
Start: 2023-10-14 — End: 2023-10-14
  Administered 2023-10-14: 75 mL via INTRAVENOUS

## 2023-10-21 ENCOUNTER — Other Ambulatory Visit: Payer: Self-pay | Admitting: Otolaryngology

## 2023-10-21 DIAGNOSIS — R220 Localized swelling, mass and lump, head: Secondary | ICD-10-CM

## 2023-10-27 ENCOUNTER — Ambulatory Visit
Admission: RE | Admit: 2023-10-27 | Discharge: 2023-10-27 | Disposition: A | Discharge status: Home / Self Care | Source: Ambulatory Visit | Attending: Otolaryngology | Admitting: Otolaryngology

## 2023-10-27 DIAGNOSIS — R220 Localized swelling, mass and lump, head: Secondary | ICD-10-CM

## 2023-11-04 ENCOUNTER — Other Ambulatory Visit: Payer: Self-pay | Admitting: Otolaryngology

## 2023-11-04 DIAGNOSIS — R599 Enlarged lymph nodes, unspecified: Secondary | ICD-10-CM

## 2023-11-04 NOTE — Progress Notes (Signed)
 Karalee Wilkie POUR, MD sent to [facility] Clar[facility] RAMAN Approved for US  guided core biopsy of LEFT submandibular LN as requested.  No sedation.  HKM

## 2023-11-16 NOTE — Progress Notes (Signed)
 Patient for US  guided Core RT submandibular LN Biopsy on Tues 11/17/23, I called and spoke with the patient on the phone and gave pre-procedure instructions. Pt was made aware to be here at 12:30p and check in at the [facility] registration desk. Pt stated understanding.  Called 11/16/23

## 2023-11-17 ENCOUNTER — Ambulatory Visit
Admission: RE | Admit: 2023-11-17 | Discharge: 2023-11-17 | Disposition: A | Discharge status: Home / Self Care | Source: Ambulatory Visit | Attending: Otolaryngology | Admitting: Otolaryngology

## 2023-11-17 DIAGNOSIS — R896 Abnormal cytological findings in specimens from other organs, systems and tissues: Secondary | ICD-10-CM | POA: Diagnosis not present

## 2023-11-17 DIAGNOSIS — R599 Enlarged lymph nodes, unspecified: Secondary | ICD-10-CM | POA: Insufficient documentation

## 2023-11-17 DIAGNOSIS — R220 Localized swelling, mass and lump, head: Secondary | ICD-10-CM

## 2023-11-17 MED ORDER — LIDOCAINE HCL (PF) 1 % IJ SOLN
5.0000 mL | Freq: Once | INTRAMUSCULAR | Status: AC
  Administered 2023-11-17: 5 mL via INTRADERMAL

## 2023-11-17 NOTE — Procedures (Signed)
 Interventional Radiology Procedure Note  Procedure: US  Guided Biopsy of right submandibular mass  Complications: None  Estimated Blood Loss: < 10 mL  Findings: 18 G core biopsy of 2 cm right submandibular mass performed under US  guidance.  3 core samples obtained and sent to Pathology.  Marcey DASEN. [place], M.D Pager:  [phone]

## 2023-11-20 LAB — SURGICAL PATHOLOGY

## 2023-11-23 ENCOUNTER — Other Ambulatory Visit (HOSPITAL_COMMUNITY): Payer: Self-pay | Admitting: Otolaryngology

## 2023-11-23 DIAGNOSIS — C8331 Diffuse large B-cell lymphoma, lymph nodes of head, face, and neck: Secondary | ICD-10-CM

## 2023-11-30 ENCOUNTER — Telehealth: Payer: Self-pay | Admitting: *Deleted

## 2023-12-02 ENCOUNTER — Encounter: Admission: RE | Admit: 2023-12-02 | Source: Ambulatory Visit

## 2023-12-04 NOTE — Telephone Encounter (Signed)
 eDr. Milissa wanting to check on the referral call back to sandy with #[phone] ex. [phone]

## 2023-12-09 ENCOUNTER — Encounter
Admission: RE | Admit: 2023-12-09 | Discharge: 2023-12-09 | Disposition: A | Discharge status: Home / Self Care | Source: Ambulatory Visit | Attending: Otolaryngology | Admitting: Otolaryngology

## 2023-12-09 DIAGNOSIS — I708 Atherosclerosis of other arteries: Secondary | ICD-10-CM | POA: Diagnosis not present

## 2023-12-09 DIAGNOSIS — R9389 Abnormal findings on diagnostic imaging of other specified body structures: Secondary | ICD-10-CM | POA: Insufficient documentation

## 2023-12-09 DIAGNOSIS — I6523 Occlusion and stenosis of bilateral carotid arteries: Secondary | ICD-10-CM | POA: Diagnosis not present

## 2023-12-09 DIAGNOSIS — D7389 Other diseases of spleen: Secondary | ICD-10-CM | POA: Insufficient documentation

## 2023-12-09 DIAGNOSIS — C8331 Diffuse large B-cell lymphoma, lymph nodes of head, face, and neck: Secondary | ICD-10-CM | POA: Insufficient documentation

## 2023-12-09 DIAGNOSIS — N261 Atrophy of kidney (terminal): Secondary | ICD-10-CM | POA: Insufficient documentation

## 2023-12-09 DIAGNOSIS — N400 Enlarged prostate without lower urinary tract symptoms: Secondary | ICD-10-CM | POA: Insufficient documentation

## 2023-12-09 DIAGNOSIS — J320 Chronic maxillary sinusitis: Secondary | ICD-10-CM | POA: Insufficient documentation

## 2023-12-09 DIAGNOSIS — I700 Atherosclerosis of aorta: Secondary | ICD-10-CM | POA: Insufficient documentation

## 2023-12-09 DIAGNOSIS — M898X8 Other specified disorders of bone, other site: Secondary | ICD-10-CM | POA: Diagnosis not present

## 2023-12-09 LAB — GLUCOSE, CAPILLARY: Glucose-Capillary: 97 mg/dL (ref 70–99)

## 2023-12-09 MED ORDER — FLUDEOXYGLUCOSE F - 18 (FDG) INJECTION
12.0000 | Freq: Once | INTRAVENOUS | Status: AC | PRN
  Administered 2023-12-09: 12.84 via INTRAVENOUS

## 2023-12-21 ENCOUNTER — Inpatient Hospital Stay

## 2023-12-21 ENCOUNTER — Inpatient Hospital Stay: Attending: Oncology | Admitting: Oncology

## 2023-12-21 ENCOUNTER — Encounter: Payer: Self-pay | Admitting: Oncology

## 2023-12-21 VITALS — BP 160/90 | HR 64 | Temp 98.2°F | Resp 18 | Ht 72.0 in | Wt 231.0 lb

## 2023-12-21 DIAGNOSIS — Z79899 Other long term (current) drug therapy: Secondary | ICD-10-CM | POA: Insufficient documentation

## 2023-12-21 DIAGNOSIS — Z87442 Personal history of urinary calculi: Secondary | ICD-10-CM | POA: Diagnosis not present

## 2023-12-21 DIAGNOSIS — Z8701 Personal history of pneumonia (recurrent): Secondary | ICD-10-CM | POA: Diagnosis not present

## 2023-12-21 DIAGNOSIS — Z801 Family history of malignant neoplasm of trachea, bronchus and lung: Secondary | ICD-10-CM | POA: Insufficient documentation

## 2023-12-21 DIAGNOSIS — Z809 Family history of malignant neoplasm, unspecified: Secondary | ICD-10-CM | POA: Diagnosis not present

## 2023-12-21 DIAGNOSIS — C8330 Diffuse large B-cell lymphoma, unspecified site: Secondary | ICD-10-CM

## 2023-12-21 DIAGNOSIS — Z8042 Family history of malignant neoplasm of prostate: Secondary | ICD-10-CM | POA: Diagnosis not present

## 2023-12-21 DIAGNOSIS — Z9089 Acquired absence of other organs: Secondary | ICD-10-CM | POA: Diagnosis not present

## 2023-12-21 DIAGNOSIS — C8338 Diffuse large B-cell lymphoma, lymph nodes of multiple sites: Secondary | ICD-10-CM | POA: Diagnosis present

## 2023-12-21 LAB — CBC WITH DIFFERENTIAL/PLATELET
Abs Immature Granulocytes: 0.05 K/uL (ref 0.00–0.07)
Basophils Absolute: 0 K/uL (ref 0.0–0.1)
Basophils Relative: 0 %
Eosinophils Absolute: 0.4 K/uL (ref 0.0–0.5)
Eosinophils Relative: 5 %
HCT: 40 % (ref 39.0–52.0)
Hemoglobin: 13.2 g/dL (ref 13.0–17.0)
Immature Granulocytes: 1 %
Lymphocytes Relative: 17 %
Lymphs Abs: 1.4 K/uL (ref 0.7–4.0)
MCH: 31.1 pg (ref 26.0–34.0)
MCHC: 33 g/dL (ref 30.0–36.0)
MCV: 94.3 fL (ref 80.0–100.0)
Monocytes Absolute: 0.7 K/uL (ref 0.1–1.0)
Monocytes Relative: 8 %
Neutro Abs: 5.9 K/uL (ref 1.7–7.7)
Neutrophils Relative %: 69 %
Platelets: 162 K/uL (ref 150–400)
RBC: 4.24 MIL/uL (ref 4.22–5.81)
RDW: 13.1 % (ref 11.5–15.5)
WBC: 8.5 K/uL (ref 4.0–10.5)
nRBC: 0 % (ref 0.0–0.2)

## 2023-12-21 LAB — CMP (CANCER CENTER ONLY)
ALT: 21 U/L (ref 0–44)
AST: 18 U/L (ref 15–41)
Albumin: 4.3 g/dL (ref 3.5–5.0)
Alkaline Phosphatase: 78 U/L (ref 38–126)
Anion gap: 8 (ref 5–15)
BUN: 23 mg/dL (ref 8–23)
CO2: 26 mmol/L (ref 22–32)
Calcium: 8.9 mg/dL (ref 8.9–10.3)
Chloride: 107 mmol/L (ref 98–111)
Creatinine: 1.27 mg/dL — ABNORMAL HIGH (ref 0.61–1.24)
GFR, Estimated: 57 mL/min — ABNORMAL LOW (ref >60)
Glucose, Bld: 88 mg/dL (ref 70–99)
Potassium: 4.4 mmol/L (ref 3.5–5.1)
Sodium: 141 mmol/L (ref 135–145)
Total Bilirubin: 1.1 mg/dL (ref 0.0–1.2)
Total Protein: 6.9 g/dL (ref 6.5–8.1)

## 2023-12-21 LAB — LACTATE DEHYDROGENASE: LDH: 144 U/L (ref 105–235)

## 2023-12-21 NOTE — Progress Notes (Unsigned)
 [facility]  [phone] [phone]  ID: Sean Kidd OB: July 20, 1945  MR#: [id number]  RDW#:[id number]  Patient Care Team: Cleotilde Oneil FALCON, MD as PCP - General (Internal Medicine)  CHIEF COMPLAINT: B-cell lymphoma  INTERVAL HISTORY: Patient is an 78 year old male who noted an enlarging painless lymph node on his right neck.  Subsequent biopsy consistent with B-cell lymphoma, but further classification could not be obtained.  He currently feels well and is asymptomatic.  He denies any dysphagia or difficulty swallowing.  He has no neurologic complaints.  He denies any recent fevers or illnesses.  He has a good appetite and denies weight loss.  He has no chest pain, shortness of breath, cough, or hemoptysis.  He denies any nausea, vomiting, constipation, or diarrhea.  He has no urinary complaints.  Patient offers no further specific complaints today.  REVIEW OF SYSTEMS:   Review of Systems  Constitutional: Negative.  Negative for fever, malaise/fatigue and weight loss.  Respiratory: Negative.  Negative for cough, hemoptysis and shortness of breath.   Cardiovascular:  Negative for chest pain and leg swelling.  Gastrointestinal: Negative.  Negative for abdominal pain.  Genitourinary: Negative.  Negative for dysuria.  Musculoskeletal: Negative.  Negative for back pain.  Skin: Negative.  Negative for rash.  Neurological: Negative.  Negative for dizziness, focal weakness, weakness and headaches.  Psychiatric/Behavioral: Negative.  The patient is not nervous/anxious.     As per HPI. Otherwise, a complete review of systems is negative.  PAST MEDICAL HISTORY: Past Medical History:  Diagnosis Date   Asthma    as a child only   Hypertension    Kidney stones    Pneumonia    Wears glasses     PAST SURGICAL HISTORY: Past Surgical History:  Procedure Laterality Date   COLONOSCOPY W/ BIOPSIES AND POLYPECTOMY     COLONOSCOPY WITH PROPOFOL  N/A 12/09/2017    Procedure: COLONOSCOPY WITH PROPOFOL ;  Surgeon: Toledo, Ladell POUR, MD;  Location: [facility] END[facility];  Service: Gastroenterology;  Laterality: N/A;   ESOPHAGOGASTRODUODENOSCOPY (EGD) WITH PROPOFOL  N/A 12/09/2017   Procedure: ESOPHAGOGASTRODUODENOSCOPY (EGD) WITH PROPOFOL ;  Surgeon: Toledo, Ladell POUR, MD;  Location: [facility] ENDOSCOPY;  Service: Gastroenterology;  Laterality: N/A;   LITHOTRIPSY     for kidney stones   LUMBAR LAMINECTOMY/DECOMPRESSION MICRODISCECTOMY Left 03/23/2014   Procedure: LUMBAR LAMINECTOMY/DECOMPRESSION MICRODISCECTOMY 1 LEVEL;  Surgeon: Reyes Budge, MD;  Location: [facility];  Service: Neurosurgery;  Laterality: Left;  Left L34 Laminectomy and foraminotomy for synovial cyst   ROTATOR CUFF REPAIR     x 3   TONSILLECTOMY      FAMILY HISTORY: Family History  Problem Relation Age of Onset   Cancer Mother    Cancer - Lung Father    Prostate cancer Brother     ADVANCED DIRECTIVES (Y/N):  N  HEALTH MAINTENANCE: Social History   Tobacco Use   Smoking status: Never   Smokeless tobacco: Never  Substance Use Topics   Drug use: No     Colonoscopy:  PAP:  Bone density:  Lipid panel:  No Known Allergies  Current Outpatient Medications  Medication Sig Dispense Refill   aspirin EC 81 MG tablet Take 81 mg by mouth.     Cholecalciferol (VITAMIN D-1000 MAX ST) 25 MCG (1000 UT) tablet Take 1,000 Units by mouth.     doxazosin (CARDURA) 4 MG tablet Take 4 mg by mouth at bedtime.     lisinopril -hydrochlorothiazide  (PRINZIDE ,ZESTORETIC ) 20-12.5 MG per tablet Take 1 tablet by mouth daily.  omeprazole (PRILOSEC) 40 MG capsule Take 40 mg by mouth daily.     tamsulosin (FLOMAX) 0.4 MG CAPS capsule Take 0.4 mg by mouth.     docusate sodium  (COLACE) 100 MG capsule Take 1 capsule (100 mg total) by mouth 2 (two) times daily. (Patient not taking: Reported on 12/21/2023) 60 capsule 0   No current facility-administered medications for this visit.    OBJECTIVE: Vitals:    12/21/23 1107 12/21/23 1110  BP: (!) 180/82 (!) 160/90  Pulse: 64   Resp: 18   Temp: 98.2 F (36.8 C)   SpO2: 97%      Body mass index is 31.33 kg/m.    ECOG FS:0 - Asymptomatic  General: Well-developed, well-nourished, no acute distress. Eyes: Pink conjunctiva, anicteric sclera. HEENT: Normocephalic, moist mucous membranes. Lungs: No audible wheezing or coughing. Heart: Regular rate and rhythm. Abdomen: Soft, nontender, no obvious distention. Musculoskeletal: No edema, cyanosis, or clubbing. Neuro: Alert, answering all questions appropriately. Cranial nerves grossly intact. Skin: No rashes or petechiae noted. Psych: Normal affect. Lymphatics: Minimally palpable right cervical lymph node.   LAB RESULTS:  Lab Results  Component Value Date   NA 141 12/21/2023   K 4.4 12/21/2023   CL 107 12/21/2023   CO2 26 12/21/2023   GLUCOSE 88 12/21/2023   BUN 23 12/21/2023   CREATININE 1.27 (H) 12/21/2023   CALCIUM 8.9 12/21/2023   PROT 6.9 12/21/2023   ALBUMIN 4.3 12/21/2023   AST 18 12/21/2023   ALT 21 12/21/2023   ALKPHOS 78 12/21/2023   BILITOT 1.1 12/21/2023   GFRNONAA 57 (L) 12/21/2023   GFRAA >90 03/21/2014    Lab Results  Component Value Date   WBC 8.5 12/21/2023   NEUTROABS 5.9 12/21/2023   HGB 13.2 12/21/2023   HCT 40.0 12/21/2023   MCV 94.3 12/21/2023   PLT 162 12/21/2023     STUDIES: NM PET Image Initial (PI) Skull Base To Thigh (F-18 FDG) Result Date: 12/11/2023 EXAM: PET AND CT SKULL BASE TO MID THIGH 12/09/2023 12:55:20 PM TECHNIQUE: RADIOPHARMACEUTICAL: 12.84 mCi F-18 FDG Uptake time 60 minutes. Glucose level 97 mg/dl. Blood pool SUV 2.9, hepatic parenchymal SUV 3.6. PET imaging was acquired from the base of the skull to the mid thighs. Non-contrast enhanced computed tomography was obtained for attenuation correction and anatomic localization. COMPARISON: None available. 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 FINDINGS:  HEAD AND NECK: Hypermetabolic right infraauricular bilateral level Ib lymph nodes, and left level IIb lymph nodes observed. Dominant right level Ib lymph node measures 1.2 cm in short axis with maximum SUV of 9.8, Deauville 5. Chronic bilateral maxillary sinusitis. Bilateral common carotid atheromatous vascular calcifications. CHEST: 0.8 cm left axillary node on image 61 series 6 with maximum SUV 9.3, Deauville 5. Calcified left hilar lymph node compatible with old granulomatous disease. No metabolically active pulmonary nodules. ABDOMEN AND PELVIS: Left paraspinal soft tissue density at the T8 level posterior to the aorta on image 72 series 6 measures 1.9 x 0.8 cm with maximum SUV 5.1, Deauville 4. Small lesion of the medial spleen with maximum SUV 5.8, Deauville 4. Severely atrophic right kidney. Periaortic rind of adenopathy up to 2.2 cm in thickness on image 115 series 6, maximum SUV 7.8, Deauville 5. Right inguinal node 1.6 cm in short axis on image 165 series 6 with maximum SUV of 11.5, Deauville 5. Nonobstructive left nephrolithiasis. Prostatomegaly. Suspected 0.9 cm bladder calculus on image 152 series 6. Physiologic activity within the gastrointestinal and genitourinary  systems. BONES AND SOFT TISSUE: Accentuated activity in the left sacrum posteriorly as shown on image 135 series 607 with maximum SUV 6.5, significance uncertain. Sclerosis posteriorly in the right iliac bone on image 128 series 6 without abnormal metabolic activity. Small cutaneous focus of stranding at the junction of the neck and upper back on image 34 series 6 with maximum SUV 2.6, nonspecific, strictly speaking cutaneous lymphoma is not excluded. Systemic atherosclerosis is present, including the aorta and iliac arteries. No metabolically active aggressive osseous lesion. IMPRESSION: 1. Hypermetabolic right infraauricular bilateral level Ib lymph nodes, left level IIb lymph nodes, left axillary node, abdominal periaortic periaortic rind  of adenopathy, and right inguinal node, consistent with diffuse large B-cell lymphoma, Deauville 5 where specified. 2. Small cutaneous focus at the cervicothoracic junction with mild uptake; cutaneous lymphoma is not excluded. 3. Left paraspinal soft tissue density at T8 and small medial splenic lesion with increased uptake, Deauville 4. 4. Focal increased uptake in the left sacrum posteriorly; significance is uncertain. 5. Chronic bilateral maxillary sinusitis. 6. Systemic atherosclerosis including bilateral common carotid calcifications, aorta, and iliac arteries. 7. Prostatomegaly. 8. Suspected 0.9 cm bladder calculus. 9. Severely atrophic right kidney. 10. Sclerosis in the posterior right iliac bone without abnormal metabolic activity. Electronically signed by: Ryan Salvage MD 12/11/2023 04:09 PM EST RP Workstation: [id number]    ASSESSMENT: B-cell lymphoma.  PLAN:    B-cell lymphoma: Biopsy results from November 17, 2023 revealed a small clonal B-cell population.  Patient noted to be CD10 positive suggesting possible underlying follicular lymphoma.  Ki-67 was only 5%.  PET scan results from December 11, 2023 reviewed independently and reported as above revealing hypermetabolic bilateral cervical, left axillary, abdominal periaortic adenopathy and right inguinal lymph node suggesting stage III disease.  These were reported as Deauville 4 or 5 which is not concordant with pathology results.  Have recommended patient undergo surgical removal of an entire lymph node to obtain more tissue for more definitive result.  Return to clinic after surgery to discuss his father's results and treatment planning if necessary.  I spent a total of 60 minutes reviewing chart data, face-to-face evaluation with the patient, counseling and coordination of care as detailed above.   Patient expressed understanding and was in agreement with this plan. He also understands that He can call clinic at any time with any  questions, concerns, or complaints.    Cancer Staging  No matching staging information was found for the patient.   Evalene JINNY Reusing, MD   12/23/2023 6:38 AM

## 2023-12-24 LAB — COMP PANEL: LEUKEMIA/LYMPHOMA: Immunophenotypic Profile: 0

## 2023-12-29 ENCOUNTER — Other Ambulatory Visit: Payer: Self-pay | Admitting: Otolaryngology

## 2024-01-05 ENCOUNTER — Inpatient Hospital Stay: Admission: RE | Admit: 2024-01-05 | Discharge: 2024-01-05 | Disposition: A | Source: Ambulatory Visit

## 2024-01-05 HISTORY — DX: Gastro-esophageal reflux disease without esophagitis: K21.9

## 2024-01-05 HISTORY — DX: Unspecified B-cell lymphoma, unspecified site: C85.10

## 2024-01-05 HISTORY — DX: Benign neoplasm of colon, unspecified: D12.6

## 2024-01-05 HISTORY — DX: Elevated prostate specific antigen (PSA): R97.20

## 2024-01-05 HISTORY — DX: Chronic kidney disease, stage 3 unspecified: N18.30

## 2024-01-05 HISTORY — DX: Benign prostatic hyperplasia without lower urinary tract symptoms: N40.0

## 2024-01-05 HISTORY — DX: Other diseases of salivary glands: K11.8

## 2024-01-06 ENCOUNTER — Inpatient Hospital Stay: Attending: Oncology | Admitting: Hospice and Palliative Medicine

## 2024-01-06 ENCOUNTER — Other Ambulatory Visit: Payer: Self-pay | Admitting: Otolaryngology

## 2024-01-06 ENCOUNTER — Inpatient Hospital Stay: Admission: RE | Admit: 2024-01-06 | Source: Ambulatory Visit

## 2024-01-06 DIAGNOSIS — Z87442 Personal history of urinary calculi: Secondary | ICD-10-CM | POA: Insufficient documentation

## 2024-01-06 DIAGNOSIS — Z79899 Other long term (current) drug therapy: Secondary | ICD-10-CM | POA: Insufficient documentation

## 2024-01-06 DIAGNOSIS — Z8701 Personal history of pneumonia (recurrent): Secondary | ICD-10-CM | POA: Insufficient documentation

## 2024-01-06 DIAGNOSIS — Z801 Family history of malignant neoplasm of trachea, bronchus and lung: Secondary | ICD-10-CM | POA: Insufficient documentation

## 2024-01-06 DIAGNOSIS — C8330 Diffuse large B-cell lymphoma, unspecified site: Secondary | ICD-10-CM

## 2024-01-06 DIAGNOSIS — C8228 Follicular lymphoma grade III, unspecified, lymph nodes of multiple sites: Secondary | ICD-10-CM | POA: Insufficient documentation

## 2024-01-06 DIAGNOSIS — Z9089 Acquired absence of other organs: Secondary | ICD-10-CM | POA: Insufficient documentation

## 2024-01-06 DIAGNOSIS — Z8042 Family history of malignant neoplasm of prostate: Secondary | ICD-10-CM | POA: Insufficient documentation

## 2024-01-06 MED ORDER — LACTATED RINGERS IV SOLN: INTRAVENOUS | Status: DC

## 2024-01-06 MED ORDER — CHLORHEXIDINE GLUCONATE 0.12 % MT SOLN
15.0000 mL | Freq: Once | OROMUCOSAL | Status: AC
  Administered 2024-01-07: 15 mL via OROMUCOSAL

## 2024-01-06 MED ORDER — ORAL CARE MOUTH RINSE: 15.0000 mL | Freq: Once | OROMUCOSAL | Status: AC

## 2024-01-06 NOTE — Progress Notes (Signed)
 Called pt multiple times to complete anesthesia interview with vm full so unable to leave a message. Called pts wife cell # as well and left her a message

## 2024-01-06 NOTE — Patient Instructions (Addendum)
 Your procedure is scheduled on: Thursday 01/07/24 Report to the Registration Desk on the 1st floor of the [facility]. To find out your arrival time, please call [phone] between 1PM - 3PM on: If your arrival time is 6:00 am, do not arrive before that time as the [facility] entrance doors do not open until 6:00 am.  REMEMBER: Instructions that are not followed completely may result in serious medical risk, up to and including death; or upon the discretion of your surgeon and anesthesiologist your surgery may need to be rescheduled.  Do not eat food after midnight the night before surgery.  No gum chewing or hard candies.  You may however, drink CLEAR liquids up to 2 hours before you are scheduled to arrive for your surgery. Do not drink anything within 2 hours of your scheduled arrival time.  Clear liquids include: - water  - apple juice without pulp Do NOT drink anything that is not on this list.   One week prior to surgery: Stop Anti-inflammatories (NSAIDS) such as Advil, Aleve, Ibuprofen, Motrin, Naproxen, Naprosyn and Aspirin based products such as Excedrin, Goody's Powder, BC Powder. Stop ANY OVER THE COUNTER supplements until after surgery.  You may however, continue to take Tylenol  if needed for pain up until the day of surgery.   Continue taking all of your other prescription medications up until the day of surgery.  ON THE DAY OF SURGERY ONLY TAKE THESE MEDICATIONS WITH SIPS OF WATER:  omeprazole (PRILOSEC) 40 MG  tamsulosin (FLOMAX) 0.4 MG    No Alcohol for 24 hours before or after surgery.  No Smoking including e-cigarettes for 24 hours before surgery.  No chewable tobacco products for at least 6 hours before surgery.  No nicotine patches on the day of surgery.  Do not use any recreational drugs for at least a week (preferably 2 weeks) before your surgery.  Please be advised that the combination of cocaine and anesthesia may have negative outcomes, up to  and including death. If you test positive for cocaine, your surgery will be cancelled.  On the morning of surgery brush your teeth with toothpaste and water, you may rinse your mouth with mouthwash if you wish. Do not swallow any toothpaste or mouthwash.  Use CHG Soap or wipes as directed on instruction sheet.  Do not wear jewelry, make-up, hairpins, clips or nail polish.  For welded (permanent) jewelry: bracelets, anklets, waist bands, etc.  Please have this removed prior to surgery.  If it is not removed, there is a chance that hospital personnel will need to cut it off on the day of surgery.  Do not wear lotions, powders, or perfumes.   Do not shave body hair from the neck down 48 hours before surgery.  Contact lenses, hearing aids and dentures may not be worn into surgery.  Do not bring valuables to the hospital. [facility] is not responsible for any missing/lost belongings or valuables.   Total Shoulder Arthroplasty:  use Benzoyl Peroxide 5% Gel as directed on instruction sheet.  Bring your C-PAP to the hospital in case you may have to spend the night.   Notify your doctor if there is any change in your medical condition (cold, fever, infection).  Wear comfortable clothing (specific to your surgery type) to the hospital.  After surgery, you can help prevent lung complications by doing breathing exercises.  Take deep breaths and cough every 1-2 hours. Your doctor may order a device called an Incentive Spirometer to help  you take deep breaths. When coughing or sneezing, hold a pillow firmly against your incision with both hands. This is called "splinting." Doing this helps protect your incision. It also decreases belly discomfort.  If you are being admitted to the hospital overnight, leave your suitcase in the car. After surgery it may be brought to your room.  In case of increased patient census, it may be necessary for you, the patient, to continue your postoperative care in  the Same Day Surgery department.  If you are being discharged the day of surgery, you will not be allowed to drive home. You will need a responsible individual to drive you home and stay with you for 24 hours after surgery.   If you are taking public transportation, you will need to have a responsible individual with you.  Please call the Pre-admissions Testing Dept. at [phone] if you have any questions about these instructions.  Surgery Visitation Policy:  Patients having surgery or a procedure may have two visitors.  Children under the age of 45 must have an adult with them who is not the patient.  Inpatient Visitation:    Visiting hours are 7 a.m. to 8 p.m. Up to four visitors are allowed at one time in a patient room. The visitors may rotate out with other people during the day.  One visitor age 48 or older may stay with the patient overnight and must be in the room by 8 p.m.   Merchandiser, Retail to address health-related social needs:  [email or web address]

## 2024-01-06 NOTE — Progress Notes (Signed)
 Multidisciplinary Oncology Council Documentation  Sean Kidd was presented by our [facility] on 01/06/2024, which included representatives from:  Palliative Care Dietitian  Physical/Occupational Therapist Nurse Navigator Genetics Social work Survivorship RN Financial Navigator Research RN   Sean Kidd currently presents with history of lymphoma  We reviewed previous medical and familial history, history of present illness, and recent lab results along with all available histopathologic and imaging studies. The MOC considered available treatment options and made the following recommendations/referrals:  SW  The MOC is a meeting of clinicians from various specialty areas who evaluate and discuss patients for whom a multidisciplinary approach is being considered. Final determinations in the plan of care are those of the provider(s).   Today's extended care, comprehensive team conference, Sean Kidd was not present for the discussion and was not examined.

## 2024-01-07 ENCOUNTER — Ambulatory Visit
Admission: RE | Admit: 2024-01-07 | Discharge: 2024-01-07 | Disposition: A | Discharge status: Home / Self Care | Attending: Otolaryngology | Admitting: Otolaryngology

## 2024-01-07 ENCOUNTER — Encounter: Payer: Self-pay | Admitting: Otolaryngology

## 2024-01-07 ENCOUNTER — Ambulatory Visit

## 2024-01-07 ENCOUNTER — Other Ambulatory Visit: Payer: Self-pay

## 2024-01-07 ENCOUNTER — Encounter: Admission: RE | Disposition: A | Payer: Self-pay | Source: Home / Self Care | Attending: Otolaryngology

## 2024-01-07 DIAGNOSIS — J45909 Unspecified asthma, uncomplicated: Secondary | ICD-10-CM | POA: Insufficient documentation

## 2024-01-07 DIAGNOSIS — Z419 Encounter for procedure for purposes other than remedying health state, unspecified: Secondary | ICD-10-CM

## 2024-01-07 DIAGNOSIS — R221 Localized swelling, mass and lump, neck: Secondary | ICD-10-CM | POA: Diagnosis present

## 2024-01-07 DIAGNOSIS — Z825 Family history of asthma and other chronic lower respiratory diseases: Secondary | ICD-10-CM | POA: Insufficient documentation

## 2024-01-07 DIAGNOSIS — I10 Essential (primary) hypertension: Secondary | ICD-10-CM | POA: Insufficient documentation

## 2024-01-07 DIAGNOSIS — K219 Gastro-esophageal reflux disease without esophagitis: Secondary | ICD-10-CM | POA: Insufficient documentation

## 2024-01-07 DIAGNOSIS — C8291 Follicular lymphoma, unspecified, lymph nodes of head, face, and neck: Secondary | ICD-10-CM | POA: Insufficient documentation

## 2024-01-07 DIAGNOSIS — N289 Disorder of kidney and ureter, unspecified: Secondary | ICD-10-CM | POA: Insufficient documentation

## 2024-01-07 HISTORY — PX: EXCISION MASS NECK: SHX6703

## 2024-01-07 SURGERY — EXCISION, MASS, NECK
Anesthesia: General | Laterality: Right

## 2024-01-07 MED ORDER — DROPERIDOL 2.5 MG/ML IJ SOLN: 0.6250 mg | Freq: Once | INTRAMUSCULAR | Status: DC | PRN

## 2024-01-07 MED ORDER — LACTATED RINGERS IV SOLN: INTRAVENOUS | Status: DC | PRN

## 2024-01-07 MED ORDER — LIDOCAINE HCL (PF) 2 % IJ SOLN
INTRAMUSCULAR | Status: AC
  Filled 2024-01-07: qty 5

## 2024-01-07 MED ORDER — OXYCODONE HCL 5 MG PO TABS: 5.0000 mg | ORAL_TABLET | Freq: Once | ORAL | Status: DC | PRN

## 2024-01-07 MED ORDER — ONDANSETRON HCL 4 MG PO TABS: 4.0000 mg | ORAL_TABLET | Freq: Three times a day (TID) | ORAL | 0 refills | Status: AC | PRN

## 2024-01-07 MED ORDER — FENTANYL CITRATE (PF) 100 MCG/2ML IJ SOLN
INTRAMUSCULAR | Status: DC | PRN
  Administered 2024-01-07: 50 ug via INTRAVENOUS

## 2024-01-07 MED ORDER — HYDROCODONE-ACETAMINOPHEN 5-325 MG PO TABS: 1.0000 | ORAL_TABLET | Freq: Four times a day (QID) | ORAL | 0 refills | Status: AC | PRN

## 2024-01-07 MED ORDER — PROPOFOL 500 MG/50ML IV EMUL
INTRAVENOUS | Status: DC | PRN
  Administered 2024-01-07: 125 ug/kg/min via INTRAVENOUS

## 2024-01-07 MED ORDER — BACITRACIN ZINC 500 UNIT/GM EX OINT
TOPICAL_OINTMENT | CUTANEOUS | Status: AC
  Filled 2024-01-07: qty 28.35

## 2024-01-07 MED ORDER — ROCURONIUM BROMIDE 10 MG/ML (PF) SYRINGE
PREFILLED_SYRINGE | INTRAVENOUS | Status: AC
  Filled 2024-01-07: qty 10

## 2024-01-07 MED ORDER — PROPOFOL 1000 MG/100ML IV EMUL
INTRAVENOUS | Status: AC
  Filled 2024-01-07: qty 100

## 2024-01-07 MED ORDER — FENTANYL CITRATE (PF) 100 MCG/2ML IJ SOLN
INTRAMUSCULAR | Status: AC
  Filled 2024-01-07: qty 2

## 2024-01-07 MED ORDER — ACETAMINOPHEN 10 MG/ML IV SOLN: 1000.0000 mg | Freq: Once | INTRAVENOUS | Status: DC | PRN

## 2024-01-07 MED ORDER — LIDOCAINE HCL (CARDIAC) PF 100 MG/5ML IV SOSY
PREFILLED_SYRINGE | INTRAVENOUS | Status: DC | PRN
  Administered 2024-01-07: 80 mg via INTRAVENOUS

## 2024-01-07 MED ORDER — CHLORHEXIDINE GLUCONATE 0.12 % MT SOLN
OROMUCOSAL | Status: AC
  Filled 2024-01-07: qty 15

## 2024-01-07 MED ORDER — PROPOFOL 10 MG/ML IV BOLUS
INTRAVENOUS | Status: AC
  Filled 2024-01-07: qty 20

## 2024-01-07 MED ORDER — FENTANYL CITRATE (PF) 100 MCG/2ML IJ SOLN: 25.0000 ug | INTRAMUSCULAR | Status: DC | PRN

## 2024-01-07 MED ORDER — LIDOCAINE-EPINEPHRINE 1 %-1:100000 IJ SOLN
INTRAMUSCULAR | Status: AC
  Filled 2024-01-07: qty 1

## 2024-01-07 MED ORDER — OXYCODONE HCL 5 MG/5ML PO SOLN: 5.0000 mg | Freq: Once | ORAL | Status: DC | PRN

## 2024-01-07 MED ORDER — LIDOCAINE-EPINEPHRINE 1 %-1:100000 IJ SOLN
INTRAMUSCULAR | Status: DC | PRN
  Administered 2024-01-07: 3 mL

## 2024-01-07 MED ORDER — 0.9 % SODIUM CHLORIDE (POUR BTL) OPTIME
TOPICAL | Status: DC | PRN
  Administered 2024-01-07: 500 mL

## 2024-01-07 SURGICAL SUPPLY — 36 items
BLADE SURG 15 STRL LF DISP TIS (BLADE) ×1 IMPLANT
CNTNR URN SCR LID CUP LEK RST (MISCELLANEOUS) IMPLANT
CORD BIP STRL DISP 12FT (MISCELLANEOUS) ×1 IMPLANT
DERMABOND ADVANCED .7 DNX12 (GAUZE/BANDAGES/DRESSINGS) IMPLANT
DRAIN WOUND RND W/TROCAR (DRAIN) IMPLANT
DRSG TEGADERM 2-3/8X2-3/4 SM (GAUZE/BANDAGES/DRESSINGS) IMPLANT
ELECTRODE EMG 20MM DUAL (MISCELLANEOUS) IMPLANT
ELECTRODE NDL 20X.3 GREEN (MISCELLANEOUS) IMPLANT
ELECTRODE NEEDLE 20X.3 GREEN (MISCELLANEOUS) IMPLANT
ELECTRODE REM PT RTRN 9FT ADLT (ELECTROSURGICAL) ×1 IMPLANT
EVACUATOR DRAINAGE 7X20 100CC (MISCELLANEOUS) IMPLANT
EVACUATOR SILICONE 100CC (DRAIN) IMPLANT
FORCEPS JEWEL BIP 4-3/4 STR (INSTRUMENTS) ×1 IMPLANT
GAUZE 4X4 16PLY ~~LOC~~+RFID DBL (SPONGE) ×1 IMPLANT
GLOVE BIO SURGEON STRL SZ7.5 (GLOVE) ×2 IMPLANT
GOWN STRL REUS W/ TWL LRG LVL3 (GOWN DISPOSABLE) ×3 IMPLANT
HEMOSTAT SURGICEL 2X3 (HEMOSTASIS) ×1 IMPLANT
HOOK STAY BLUNT/RETRACTOR 5M (MISCELLANEOUS) IMPLANT
LABEL OR SOLS (LABEL) ×1 IMPLANT
MANIFOLD NEPTUNE II (INSTRUMENTS) ×1 IMPLANT
NS IRRIG 500ML POUR BTL (IV SOLUTION) ×1 IMPLANT
PACK HEAD/NECK (MISCELLANEOUS) ×1 IMPLANT
PAD MAGNETIC INSTR ST 16X20 (MISCELLANEOUS) ×1 IMPLANT
PROBE MONO 100X0.75 ELECT 1.9M (MISCELLANEOUS) IMPLANT
PROBE NEUROSIGN BIPOL (MISCELLANEOUS) IMPLANT
SHEARS HARMONIC 9CM CVD (BLADE) ×1 IMPLANT
SOLN STERILE WATER 500 ML (IV SOLUTION) ×1 IMPLANT
SPONGE KITTNER 5P (MISCELLANEOUS) ×1 IMPLANT
STAPLER SKIN PROX 35W (STAPLE) IMPLANT
STRIP CLOSURE SKIN 1/4X4 (GAUZE/BANDAGES/DRESSINGS) IMPLANT
SUT PROLENE 6 0 P 1 18 (SUTURE) ×1 IMPLANT
SUT SILK 2 0 SH (SUTURE) ×1 IMPLANT
SUT SILK 2-0 18XBRD TIE 12 (SUTURE) ×1 IMPLANT
SUT VIC AB 4-0 RB1 18 (SUTURE) ×1 IMPLANT
SUTURE ETHLN 4-0 FS2 18XMF BLK (SUTURE) ×1 IMPLANT
TRAP FLUID SMOKE EVACUATOR (MISCELLANEOUS) ×1 IMPLANT

## 2024-01-07 NOTE — Op Note (Signed)
..  01/07/2024  8:16 AM    Sean Kidd  [id number]   Pre-Op Dx: Swelling, Mass or Lump, Neck, Lymphoma  Post-op Dx: SAME  Proc: Excision of deep right cervical lymph node  Surg:  Carolee Vaeda [facility]  Anes:  IV sedation  EBL:  1ccs  Comp:  None  Indications:  Right infraauricular and submandibular mass with core biopsy consistent with B-cell lymphoma but more tissue is needed.  PET avid infra-auricular and right level IIB lymph nodes.  Findings:  Palpable and firm mass in right infraauricular region in close association with tail of paroid removed for lymphoma workup.  Description of Procedure:  After the patient was identified in holding and the history and physical and consent was reviewed and updated.  The patient was marked in the normal fashion.  The patient was next taken to the operating room and placed in a supine position.  General endotracheal anesthesia was induced in the normal fashion.  The patient's RIGHT neck was neck injected with 3cc's of 1% lidocaine  with 1:100,000 Epinephrine .  The patient was next prepped and draped in a sterile normal fashion.  At this time, a 15 blade scalpel was used to make a skin incision along a previously marked neck crease.  Dissection was carefully performed through the subcutaneous tissues with combination of bipolar electrocautery and Bovie electrocautery and blunt dissection.  A firm palpable mass consistent with PET avid right infra-auricular lymph node was encountered just deep to the platysma muscle.  Blunt dissection with circumferential dissection around the mass was taken.  This was adherent to underlying fascia as well as tail of parotid superiorly.  Sharp and blunt dissection was used.  This mass was fully removed.  No further palpable masses were present in the infra-auricular region that was PET positive and larter to the SCM.  SABRA  The wound was copiously irrigated with sterile saline.  Meticulous hemostasis with bipolar was obtained.   Surgicel was placed in the wound bed.  The wound was then closed in a multilayered fashion with vicryl for subcutaneous tissues and Dermabond type skin adhesive for the cutaneous skin and topped with a steri-strip.  At this time the patient was taken to PACU in good condition.  Plan:  Follow pathology with lymphoma workup.  Follow up next week for post-operative evaluation.  [name]  01/07/2024 8:16 AM

## 2024-01-07 NOTE — Transfer of Care (Signed)
 Immediate Anesthesia Transfer of Care Note  Patient: Sean Kidd  Procedure(s) Performed: EXCISION, MASS, NECK (Right)  Patient Location: PACU  Anesthesia Type:General  Level of Consciousness: awake  Airway & Oxygen Therapy: spontaneous breathing on RA  Post-op Assessment: Report given to RN and Post -op Vital signs reviewed and stable  Post vital signs: Reviewed and stable  Last Vitals:  Vitals Value Taken Time  BP 155/83 01/07/24 08:21  Temp    Pulse 62 01/07/24 08:22  Resp 11 01/07/24 08:22  SpO2 98 % 01/07/24 08:22  Vitals shown include unfiled device data.  Last Pain:  Vitals:   01/07/24 0628  TempSrc: Temporal  PainSc: 0-No pain         Complications: No notable events documented.

## 2024-01-07 NOTE — Anesthesia Preprocedure Evaluation (Signed)
 Anesthesia Evaluation  Patient identified by MRN, date of birth, ID band Patient awake    Reviewed: Allergy & Precautions, H&P , NPO status , Patient's Chart, lab work & pertinent test results, reviewed documented beta blocker date and time   Airway Mallampati: II  TM Distance: >3 FB Neck ROM: full    Dental  (+) Teeth Intact   Pulmonary asthma , pneumonia, resolved   Pulmonary exam normal        Cardiovascular Exercise Tolerance: Good hypertension, On Medications negative cardio ROS Normal cardiovascular exam Rate:Normal     Neuro/Psych negative neurological ROS  negative psych ROS   GI/Hepatic Neg liver ROS,GERD  Medicated,,  Endo/Other  negative endocrine ROS    Renal/GU Renal disease  negative genitourinary   Musculoskeletal   Abdominal   Peds  Hematology negative hematology ROS (+)   Anesthesia Other Findings   Reproductive/Obstetrics negative OB ROS                              Anesthesia Physical Anesthesia Plan  ASA: 3  Anesthesia Plan: General LMA   Post-op Pain Management:    Induction:   PONV Risk Score and Plan: 3  Airway Management Planned:   Additional Equipment:   Intra-op Plan:   Post-operative Plan:   Informed Consent: I have reviewed the patients History and Physical, chart, labs and discussed the procedure including the risks, benefits and alternatives for the proposed anesthesia with the patient or authorized representative who has indicated his/her understanding and acceptance.       Plan Discussed with: CRNA  Anesthesia Plan Comments:         Anesthesia Quick Evaluation

## 2024-01-07 NOTE — H&P (Signed)
 ..  History and Physical paper copy reviewed and updated date of procedure and will be scanned into system.  Patient seen and examined and marked.

## 2024-01-08 ENCOUNTER — Encounter: Payer: Self-pay | Admitting: Otolaryngology

## 2024-01-12 LAB — SURGICAL PATHOLOGY

## 2024-01-14 ENCOUNTER — Inpatient Hospital Stay: Admitting: Oncology

## 2024-01-14 ENCOUNTER — Encounter: Payer: Self-pay | Admitting: Oncology

## 2024-01-14 VITALS — BP 128/70 | HR 62 | Temp 97.9°F | Resp 18 | Ht 72.0 in | Wt 228.0 lb

## 2024-01-14 DIAGNOSIS — C8290 Follicular lymphoma, unspecified, unspecified site: Secondary | ICD-10-CM | POA: Insufficient documentation

## 2024-01-14 DIAGNOSIS — Z87442 Personal history of urinary calculi: Secondary | ICD-10-CM | POA: Diagnosis not present

## 2024-01-14 DIAGNOSIS — Z8042 Family history of malignant neoplasm of prostate: Secondary | ICD-10-CM | POA: Diagnosis not present

## 2024-01-14 DIAGNOSIS — C8218 Follicular lymphoma grade II, lymph nodes of multiple sites: Secondary | ICD-10-CM | POA: Diagnosis not present

## 2024-01-14 DIAGNOSIS — C8220 Follicular lymphoma grade III, unspecified, unspecified site: Secondary | ICD-10-CM | POA: Diagnosis not present

## 2024-01-14 DIAGNOSIS — Z9089 Acquired absence of other organs: Secondary | ICD-10-CM | POA: Diagnosis not present

## 2024-01-14 DIAGNOSIS — Z801 Family history of malignant neoplasm of trachea, bronchus and lung: Secondary | ICD-10-CM | POA: Diagnosis not present

## 2024-01-14 DIAGNOSIS — C8228 Follicular lymphoma grade III, unspecified, lymph nodes of multiple sites: Secondary | ICD-10-CM | POA: Diagnosis present

## 2024-01-14 DIAGNOSIS — Z79899 Other long term (current) drug therapy: Secondary | ICD-10-CM | POA: Diagnosis not present

## 2024-01-14 DIAGNOSIS — Z8701 Personal history of pneumonia (recurrent): Secondary | ICD-10-CM | POA: Diagnosis not present

## 2024-01-14 NOTE — Progress Notes (Signed)
 [facility]  Telephone:([phone]) [phone] [phone]) [phone]  ID: Sean Kidd OB: 03/30/1945  MR#: [id number]  RDW#:[id number]  Patient Care Team: Cleotilde Oneil FALCON, MD as PCP - General (Internal Medicine) Sean Evalene PARAS, MD as Consulting Physician (Oncology)  CHIEF COMPLAINT: Stage III follicular lymphoma.  INTERVAL HISTORY: Patient returns to clinic today for further evaluation, discussion of his final pathology results, and treatment planning.  He has noted a new lymph node increase in size in his right cervical chain, but otherwise has felt well.  He denies any dysphagia or difficulty swallowing.  He has no neurologic complaints.  He denies any recent fevers or illnesses.  He has a good appetite and denies weight loss.  He has no chest pain, shortness of breath, cough, or hemoptysis.  He denies any nausea, vomiting, constipation, or diarrhea.  He has no urinary complaints.  Patient offers no further specific complaints today.  REVIEW OF SYSTEMS:   Review of Systems  Constitutional: Negative.  Negative for fever, malaise/fatigue and weight loss.  Respiratory: Negative.  Negative for cough, hemoptysis and shortness of breath.   Cardiovascular:  Negative for chest pain and leg swelling.  Gastrointestinal: Negative.  Negative for abdominal pain.  Genitourinary: Negative.  Negative for dysuria.  Musculoskeletal: Negative.  Negative for back pain.  Skin: Negative.  Negative for rash.  Neurological: Negative.  Negative for dizziness, focal weakness, weakness and headaches.  Psychiatric/Behavioral: Negative.  The patient is not nervous/anxious.     As per HPI. Otherwise, a complete review of systems is negative.  PAST MEDICAL HISTORY: Past Medical History:  Diagnosis Date   Abnormal prostate specific antigen (PSA)    Asthma    as a child only   B-cell lymphoma (HCC)    BPH (benign prostatic hyperplasia)    CKD (chronic kidney disease), stage III (HCC)    Colon  adenoma    GERD (gastroesophageal reflux disease)    Hypertension    Kidney stones    Pneumonia    Submandibular gland mass    Wears glasses     PAST SURGICAL HISTORY: Past Surgical History:  Procedure Laterality Date   COLONOSCOPY W/ BIOPSIES AND POLYPECTOMY     COLONOSCOPY WITH PROPOFOL  N/A 12/09/2017   Procedure: COLONOSCOPY WITH PROPOFOL ;  Surgeon: Toledo, Ladell POUR, MD;  Location: [facility] ENDOSCOPY;  Service: Gastroenterology;  Laterality: N/A;   ESOPHAGOGASTRODUODENOSCOPY (EGD) WITH PROPOFOL  N/A 12/09/2017   Procedure: ESOPHAGOGASTRODUODENOSCOPY (EGD) WITH PROPOFOL ;  Surgeon: Toledo, Ladell POUR, MD;  Location: [facility] ENDOSCOPY;  Service: Gastroenterology;  Laterality: N/A;   EXCISION MASS NECK Right 01/07/2024   Procedure: EXCISION, MASS, NECK;  Surgeon: Milissa Hamming, MD;  Location: [facility] ORS;  Service: ENT;  Laterality: Right;  EXCISION RIGHT NECK MASS   LITHOTRIPSY     for kidney stones   LUMBAR LAMINECTOMY/DECOMPRESSION MICRODISCECTOMY Left 03/23/2014   Procedure: LUMBAR LAMINECTOMY/DECOMPRESSION MICRODISCECTOMY 1 LEVEL;  Surgeon: Reyes Budge, MD;  Location: [facility]UR[facility] OR[facility];  Service: Neurosurgery;  Laterality: Left;  Left L34 Laminectomy and foraminotomy for synovial cyst   ROTATOR CUFF REPAIR     x 3   TONSILLECTOMY      FAMILY HISTORY: Family History  Problem Relation Age of Onset   Cancer Mother    Cancer - Lung Father    Prostate cancer Brother     ADVANCED DIRECTIVES (Y/N):  N  HEALTH MAINTENANCE: Social History   Tobacco Use   Smoking status: Never   Smokeless tobacco: Never  Substance Use Topics  Drug use: No     Colonoscopy:  PAP:  Bone density:  Lipid panel:  No Known Allergies  Current Outpatient Medications  Medication Sig Dispense Refill   Cholecalciferol (VITAMIN D-1000 MAX ST) 25 MCG (1000 UT) tablet Take 1,000 Units by mouth.     doxazosin (CARDURA) 4 MG tablet Take 4 mg by mouth at bedtime.     HYDROcodone -acetaminophen  (NORCO/VICODIN)  5-325 MG tablet Take 1 tablet by mouth every 6 (six) hours as needed for moderate pain (pain score 4-6). 10 tablet 0   lisinopril -hydrochlorothiazide  (PRINZIDE ,ZESTORETIC ) 20-12.5 MG per tablet Take 1 tablet by mouth daily.     omeprazole (PRILOSEC) 40 MG capsule Take 40 mg by mouth daily.     ondansetron  (ZOFRAN ) 4 MG tablet Take 1 tablet (4 mg total) by mouth every 8 (eight) hours as needed for nausea or vomiting. 20 tablet 0   tamsulosin (FLOMAX) 0.4 MG CAPS capsule Take 0.4 mg by mouth.     aspirin EC 81 MG tablet Take 81 mg by mouth. (Patient not taking: Reported on 01/14/2024)     docusate sodium  (COLACE) 100 MG capsule Take 1 capsule (100 mg total) by mouth 2 (two) times daily. (Patient not taking: Reported on 01/14/2024) 60 capsule 0   No current facility-administered medications for this visit.    OBJECTIVE: Vitals:   01/14/24 1111  BP: 128/70  Pulse: 62  Resp: 18  Temp: 97.9 F (36.6 C)  SpO2: 97%     Body mass index is 30.92 kg/m.    ECOG FS:0 - Asymptomatic  General: Well-developed, well-nourished, no acute distress. Eyes: Pink conjunctiva, anicteric sclera. HEENT: Normocephalic, moist mucous membranes. Lungs: No audible wheezing or coughing. Heart: Regular rate and rhythm. Abdomen: Soft, nontender, no obvious distention. Musculoskeletal: No edema, cyanosis, or clubbing. Neuro: Alert, answering all questions appropriately. Cranial nerves grossly intact. Skin: No rashes or petechiae noted. Psych: Normal affect. Lymphatics: Easily palpable right cervical lymph node.   LAB RESULTS:  Lab Results  Component Value Date   NA 141 12/21/2023   K 4.4 12/21/2023   CL 107 12/21/2023   CO2 26 12/21/2023   GLUCOSE 88 12/21/2023   BUN 23 12/21/2023   CREATININE 1.27 (H) 12/21/2023   CALCIUM 8.9 12/21/2023   PROT 6.9 12/21/2023   ALBUMIN 4.3 12/21/2023   AST 18 12/21/2023   ALT 21 12/21/2023   ALKPHOS 78 12/21/2023   BILITOT 1.1 12/21/2023   GFRNONAA 57 (L)  12/21/2023   GFRAA >90 03/21/2014    Lab Results  Component Value Date   WBC 8.5 12/21/2023   NEUTROABS 5.9 12/21/2023   HGB 13.2 12/21/2023   HCT 40.0 12/21/2023   MCV 94.3 12/21/2023   PLT 162 12/21/2023     STUDIES: No results found.   ASSESSMENT: Stage III follicular lymphoma.SABRA  PLAN:    Stage III follicular lymphoma: Resection of lymph node on January 07, 2024 confirmed diagnosis.  PET scan results from December 11, 2023 reviewed independently and reported as above revealing hypermetabolic bilateral cervical, left axillary, abdominal periaortic adenopathy and right inguinal lymph node confirming stage of disease.  Bone marrow biopsy is not necessary at this time.  After lengthy discussion with the patient and his daughter whether to pursue continued active surveillance or treatment, the patient has elected treatment and will return to clinic on February 12, 2024 to initiate cycle 1 of 4 of weekly Rituxan.  Will then repeat PET scan 3 months after treatment.  I spent a total of  30 minutes reviewing chart data, face-to-face evaluation with the patient, counseling and coordination of care as detailed above.    Patient expressed understanding and was in agreement with this plan. He also understands that He can call clinic at any time with any questions, concerns, or complaints.    Cancer Staging  Follicular lymphoma (HCC) Staging form: Hodgkin and Non-Hodgkin Lymphoma, AJCC 8th Edition - Clinical stage from 01/14/2024: Stage III - Signed by Sean Evalene PARAS, MD on 01/14/2024 Stage prefix: Initial diagnosis   Evalene Kidd Jacobo, MD   01/14/2024 1:42 PM

## 2024-01-14 NOTE — Progress Notes (Signed)
 Patient isn't really having any symptoms as of right now.

## 2024-01-14 NOTE — Progress Notes (Signed)
 START ON PATHWAY REGIMEN - Lymphoma and CLL     A cycle is every 7 days:     Rituximab-xxxx   **Always confirm dose/schedule in your pharmacy ordering system**  Patient Characteristics: Follicular Lymphoma, Grades 1, 2, and 3A, First Line, Stage III / IV, Asymptomatic or Low Bulk Disease Disease Type: Not Applicable Disease Type: Follicular Lymphoma, Grade 1, 2, or 3A Disease Type: Not Applicable Line of Therapy: First Line Disease Characteristics: Asymptomatic or Low Bulk Disease Intent of Therapy: Non-Curative / Palliative Intent, Discussed with Patient

## 2024-01-15 ENCOUNTER — Telehealth: Payer: Self-pay

## 2024-01-15 ENCOUNTER — Other Ambulatory Visit: Payer: Self-pay

## 2024-01-15 NOTE — Telephone Encounter (Signed)
 Clinical Social Work was referred by [facility] for assessment of psychosocial needs.  CSW attempted to contact patient by phone.  Left voicemail with contact information and request for return call.

## 2024-01-17 ENCOUNTER — Other Ambulatory Visit: Payer: Self-pay

## 2024-01-18 ENCOUNTER — Other Ambulatory Visit: Payer: Self-pay

## 2024-01-18 NOTE — Anesthesia Postprocedure Evaluation (Signed)
 Anesthesia Post Note  Patient: Sean Kidd  Procedure(s) Performed: EXCISION, MASS, NECK (Right)  Patient location during evaluation: PACU Anesthesia Type: General Level of consciousness: awake and alert Pain management: pain level controlled Vital Signs Assessment: post-procedure vital signs reviewed and stable Respiratory status: spontaneous breathing, nonlabored ventilation, respiratory function stable and patient connected to nasal cannula oxygen Cardiovascular status: blood pressure returned to baseline and stable Postop Assessment: no apparent nausea or vomiting Anesthetic complications: no   No notable events documented.   Last Vitals:  Vitals:   01/07/24 0840 01/07/24 0848  BP: (!) 174/89 (!) 175/98  Pulse: (!) 58 60  Resp: 15 18  Temp: 36.5 C (!) 36.2 C  SpO2: 99% 99%    Last Pain:  Vitals:   01/07/24 0848  TempSrc: Temporal  PainSc: 0-No pain                 Lynwood KANDICE Clause

## 2024-01-19 ENCOUNTER — Inpatient Hospital Stay

## 2024-02-02 NOTE — Progress Notes (Signed)
 Pharmacist Chemotherapy Monitoring - Initial Assessment    Anticipated start date: 02/12/24   The following has been reviewed per standard work regarding the patient's treatment regimen: The patient's diagnosis, treatment plan and drug doses, and organ/hematologic function Lab orders and baseline tests specific to treatment regimen  The treatment plan start date, drug sequencing, and pre-medications Prior authorization status  Patient's documented medication list, including drug-drug interaction screen and prescriptions for anti-emetics and supportive care specific to the treatment regimen The drug concentrations, fluid compatibility, administration routes, and timing of the medications to be used The patient's access for treatment and lifetime cumulative dose history, if applicable  The patient's medication allergies and previous infusion related reactions, if applicable   Changes made to treatment plan:  N/A  Follow up needed:  Hep B labs   Sean Kidd, [facility], 02/02/2024  3:32 PM

## 2024-02-12 ENCOUNTER — Inpatient Hospital Stay: Attending: Oncology

## 2024-02-12 ENCOUNTER — Encounter: Payer: Self-pay | Admitting: Oncology

## 2024-02-12 ENCOUNTER — Inpatient Hospital Stay

## 2024-02-12 ENCOUNTER — Inpatient Hospital Stay: Admitting: Oncology

## 2024-02-12 VITALS — BP 137/67 | HR 70 | Temp 97.6°F | Resp 16 | Ht 72.0 in | Wt 232.0 lb

## 2024-02-12 VITALS — BP 129/62 | HR 77 | Temp 97.4°F | Resp 16

## 2024-02-12 DIAGNOSIS — C8228 Follicular lymphoma grade III, unspecified, lymph nodes of multiple sites: Secondary | ICD-10-CM | POA: Insufficient documentation

## 2024-02-12 DIAGNOSIS — Z8042 Family history of malignant neoplasm of prostate: Secondary | ICD-10-CM | POA: Insufficient documentation

## 2024-02-12 DIAGNOSIS — Z5112 Encounter for antineoplastic immunotherapy: Secondary | ICD-10-CM | POA: Diagnosis present

## 2024-02-12 DIAGNOSIS — Z9089 Acquired absence of other organs: Secondary | ICD-10-CM | POA: Insufficient documentation

## 2024-02-12 DIAGNOSIS — Z79899 Other long term (current) drug therapy: Secondary | ICD-10-CM | POA: Insufficient documentation

## 2024-02-12 DIAGNOSIS — Z87442 Personal history of urinary calculi: Secondary | ICD-10-CM | POA: Insufficient documentation

## 2024-02-12 DIAGNOSIS — C8218 Follicular lymphoma grade II, lymph nodes of multiple sites: Secondary | ICD-10-CM | POA: Diagnosis not present

## 2024-02-12 DIAGNOSIS — Z8701 Personal history of pneumonia (recurrent): Secondary | ICD-10-CM | POA: Diagnosis not present

## 2024-02-12 DIAGNOSIS — Z801 Family history of malignant neoplasm of trachea, bronchus and lung: Secondary | ICD-10-CM | POA: Insufficient documentation

## 2024-02-12 DIAGNOSIS — Z809 Family history of malignant neoplasm, unspecified: Secondary | ICD-10-CM | POA: Insufficient documentation

## 2024-02-12 DIAGNOSIS — C8220 Follicular lymphoma grade III, unspecified, unspecified site: Secondary | ICD-10-CM

## 2024-02-12 LAB — COMPREHENSIVE METABOLIC PANEL WITH GFR
ALT: 13 U/L (ref 0–44)
AST: 18 U/L (ref 15–41)
Albumin: 4.1 g/dL (ref 3.5–5.0)
Alkaline Phosphatase: 89 U/L (ref 38–126)
Anion gap: 10 (ref 5–15)
BUN: 23 mg/dL (ref 8–23)
CO2: 26 mmol/L (ref 22–32)
Calcium: 9.3 mg/dL (ref 8.9–10.3)
Chloride: 107 mmol/L (ref 98–111)
Creatinine, Ser: 1.34 mg/dL — ABNORMAL HIGH (ref 0.61–1.24)
GFR, Estimated: 54 mL/min — ABNORMAL LOW
Glucose, Bld: 91 mg/dL (ref 70–99)
Potassium: 3.8 mmol/L (ref 3.5–5.1)
Sodium: 143 mmol/L (ref 135–145)
Total Bilirubin: 0.5 mg/dL (ref 0.0–1.2)
Total Protein: 6.4 g/dL — ABNORMAL LOW (ref 6.5–8.1)

## 2024-02-12 LAB — CBC WITH DIFFERENTIAL (CANCER CENTER ONLY)
Abs Immature Granulocytes: 0.07 K/uL (ref 0.00–0.07)
Basophils Absolute: 0.1 K/uL (ref 0.0–0.1)
Basophils Relative: 1 %
Eosinophils Absolute: 0.7 K/uL — ABNORMAL HIGH (ref 0.0–0.5)
Eosinophils Relative: 11 %
HCT: 37.3 % — ABNORMAL LOW (ref 39.0–52.0)
Hemoglobin: 12.6 g/dL — ABNORMAL LOW (ref 13.0–17.0)
Immature Granulocytes: 1 %
Lymphocytes Relative: 21 %
Lymphs Abs: 1.4 K/uL (ref 0.7–4.0)
MCH: 32.1 pg (ref 26.0–34.0)
MCHC: 33.8 g/dL (ref 30.0–36.0)
MCV: 94.9 fL (ref 80.0–100.0)
Monocytes Absolute: 0.6 K/uL (ref 0.1–1.0)
Monocytes Relative: 10 %
Neutro Abs: 3.8 K/uL (ref 1.7–7.7)
Neutrophils Relative %: 56 %
Platelet Count: 140 K/uL — ABNORMAL LOW (ref 150–400)
RBC: 3.93 MIL/uL — ABNORMAL LOW (ref 4.22–5.81)
RDW: 12.9 % (ref 11.5–15.5)
WBC Count: 6.6 K/uL (ref 4.0–10.5)
nRBC: 0 % (ref 0.0–0.2)

## 2024-02-12 LAB — HEPATITIS B SURFACE ANTIGEN: Hepatitis B Surface Ag: NONREACTIVE

## 2024-02-12 MED ORDER — SODIUM CHLORIDE 0.9 % IV SOLN
375.0000 mg/m2 | Freq: Once | INTRAVENOUS | Status: AC
  Administered 2024-02-12: 900 mg via INTRAVENOUS
  Filled 2024-02-12: qty 40

## 2024-02-12 MED ORDER — DIPHENHYDRAMINE HCL 25 MG PO TABS
25.0000 mg | ORAL_TABLET | Freq: Once | ORAL | Status: AC
  Administered 2024-02-12: 25 mg via ORAL
  Filled 2024-02-12: qty 1

## 2024-02-12 MED ORDER — ACETAMINOPHEN 325 MG PO TABS
650.0000 mg | ORAL_TABLET | Freq: Once | ORAL | Status: AC
  Administered 2024-02-12: 650 mg via ORAL
  Filled 2024-02-12: qty 2

## 2024-02-12 MED ORDER — SODIUM CHLORIDE 0.9 % IV SOLN
INTRAVENOUS | Status: DC
  Filled 2024-02-12: qty 250

## 2024-02-12 NOTE — Progress Notes (Signed)
 Patient is doing ok, no questions for the doctor today.

## 2024-02-12 NOTE — Patient Instructions (Signed)
 [facility] - A DEPT OF [facility]  Discharge Instructions: Thank you for choosing [facility] to provide your oncology and hematology care.  If you have a lab appointment with the Cancer Center, please go directly to the Cancer Center and check in at the registration area.  Wear comfortable clothing and clothing appropriate for easy access to any Portacath or PICC line.   We strive to give you quality time with your provider. You may need to reschedule your appointment if you arrive late (15 or more minutes).  Arriving late affects you and other patients whose appointments are after yours.  Also, if you miss three or more appointments without notifying the office, you may be dismissed from the clinic at the providers discretion.      For prescription refill requests, have your pharmacy contact our office and allow 72 hours for refills to be completed.    Today you received the following chemotherapy and/or immunotherapy agents: riTUXimab -pvvr (RUXIENCE )       To help prevent nausea and vomiting after your treatment, we encourage you to take your nausea medication as directed.  BELOW ARE SYMPTOMS THAT SHOULD BE REPORTED IMMEDIATELY: *FEVER GREATER THAN 100.4 F (38 C) OR HIGHER *CHILLS OR SWEATING *NAUSEA AND VOMITING THAT IS NOT CONTROLLED WITH YOUR NAUSEA MEDICATION *UNUSUAL SHORTNESS OF BREATH *UNUSUAL BRUISING OR BLEEDING *URINARY PROBLEMS (pain or burning when urinating, or frequent urination) *BOWEL PROBLEMS (unusual diarrhea, constipation, pain near the anus) TENDERNESS IN MOUTH AND THROAT WITH OR WITHOUT PRESENCE OF ULCERS (sore throat, sores in mouth, or a toothache) UNUSUAL RASH, SWELLING OR PAIN  UNUSUAL VAGINAL DISCHARGE OR ITCHING   Items with * indicate a potential emergency and should be followed up as soon as possible or go to the Emergency Department if any problems should occur.  Please show the CHEMOTHERAPY ALERT CARD  or IMMUNOTHERAPY ALERT CARD at check-in to the Emergency Department and triage nurse.  Should you have questions after your visit or need to cancel or reschedule your appointment, please contact [facility] - A DEPT OF [facility]  [phone] and follow the prompts.  Office hours are 8:00 a.m. to 4:30 p.m. Monday - Friday. Please note that voicemails left after 4:00 p.m. may not be returned until the following business day.  We are closed weekends and major holidays. You have access to a nurse at all times for urgent questions. Please call the main number to the clinic [phone] and follow the prompts.  For any non-urgent questions, you may also contact your provider using MyChart. We now offer e-Visits for anyone 88 and older to request care online for non-urgent symptoms. For details visit [email or web address].   Also download the MyChart app! Go to the app store, search MyChart, open the app, select [facility], and log in with your MyChart username and password.

## 2024-02-12 NOTE — Progress Notes (Signed)
 " [facility]  [phone] [phone]) [phone]  ID: Sean Kidd OB: 06-05-1945  MR#: [id number]  RDW#:[id number]  Patient Care Team: Cleotilde Oneil FALCON, MD as PCP - General (Internal Medicine) Jacobo Evalene PARAS, MD as Consulting Physician (Oncology)  CHIEF COMPLAINT: Stage III follicular lymphoma.  INTERVAL HISTORY: Patient returns to clinic today for further evaluation and initiation of cycle 1 of 4 of weekly Rituxan .  He continues to feel well and remains asymptomatic.  He denies any dysphagia or difficulty swallowing.  He has no neurologic complaints.  He denies any recent fevers or illnesses.  He has a good appetite and denies weight loss.  He has no chest pain, shortness of breath, cough, or hemoptysis.  He denies any nausea, vomiting, constipation, or diarrhea.  He has no urinary complaints.  Patient offers no specific complaints today.  REVIEW OF SYSTEMS:   Review of Systems  Constitutional: Negative.  Negative for fever, malaise/fatigue and weight loss.  Respiratory: Negative.  Negative for cough, hemoptysis and shortness of breath.   Cardiovascular:  Negative for chest pain and leg swelling.  Gastrointestinal: Negative.  Negative for abdominal pain.  Genitourinary: Negative.  Negative for dysuria.  Musculoskeletal: Negative.  Negative for back pain.  Skin: Negative.  Negative for rash.  Neurological: Negative.  Negative for dizziness, focal weakness, weakness and headaches.  Psychiatric/Behavioral: Negative.  The patient is not nervous/anxious.     As per HPI. Otherwise, a complete review of systems is negative.  PAST MEDICAL HISTORY: Past Medical History:  Diagnosis Date   Abnormal prostate specific antigen (PSA)    Asthma    as a child only   B-cell lymphoma (HCC)    BPH (benign prostatic hyperplasia)    CKD (chronic kidney disease), stage III (HCC)    Colon adenoma    GERD (gastroesophageal reflux disease)    Hypertension    Kidney  stones    Pneumonia    Submandibular gland mass    Wears glasses     PAST SURGICAL HISTORY: Past Surgical History:  Procedure Laterality Date   COLONOSCOPY W/ BIOPSIES AND POLYPECTOMY     COLONOSCOPY WITH PROPOFOL  N/A 12/09/2017   Procedure: COLONOSCOPY WITH PROPOFOL ;  Surgeon: Toledo, Ladell POUR, MD;  Location: [facility];  Service: Gastroenterology;  Laterality: N/A;   ESOPHAGOGASTRODUODENOSCOPY (EGD) WITH PROPOFOL  N/A 12/09/2017   Procedure: ESOPHAGOGASTRODUODENOSCOPY (EGD) WITH PROPOFOL ;  Surgeon: Toledo, Ladell POUR, MD;  Location: [facility];  Service: Gastroenterology;  Laterality: N/A;   EXCISION MASS NECK Right 01/07/2024   Procedure: EXCISION, MASS, NECK;  Surgeon: Milissa Hamming, MD;  Location: [facility] OR[facility];  Service: ENT;  Laterality: Right;  EXCISION RIGHT NECK MASS   LITHOTRIPSY     for kidney stones   LUMBAR LAMINECTOMY/DECOMPRESSION MICRODISCECTOMY Left 03/23/2014   Procedure: LUMBAR LAMINECTOMY/DECOMPRESSION MICRODISCECTOMY 1 LEVEL;  Surgeon: Reyes Budge, MD;  Location: [facility];  Service: Neurosurgery;  Laterality: Left;  Left L34 Laminectomy and foraminotomy for synovial cyst   ROTATOR CUFF REPAIR     x 3   TONSILLECTOMY      FAMILY HISTORY: Family History  Problem Relation Age of Onset   Cancer Mother    Cancer - Lung Father    Prostate cancer Brother     ADVANCED DIRECTIVES (Y/N):  N  HEALTH MAINTENANCE: Social History   Tobacco Use   Smoking status: Never   Smokeless tobacco: Never  Substance Use Topics   Drug use: No     Colonoscopy:  PAP:  Bone density:  Lipid panel:  No Known Allergies  Current Outpatient Medications  Medication Sig Dispense Refill   Cholecalciferol (VITAMIN D-1000 MAX ST) 25 MCG (1000 UT) tablet Take 1,000 Units by mouth.     doxazosin (CARDURA) 4 MG tablet Take 4 mg by mouth at bedtime.     HYDROcodone -acetaminophen  (NORCO/VICODIN) 5-325 MG tablet Take 1 tablet by mouth every 6 (six) hours as needed for  moderate pain (pain score 4-6). 10 tablet 0   lisinopril -hydrochlorothiazide  (PRINZIDE ,ZESTORETIC ) 20-12.5 MG per tablet Take 1 tablet by mouth daily.     omeprazole (PRILOSEC) 40 MG capsule Take 40 mg by mouth daily.     ondansetron  (ZOFRAN ) 4 MG tablet Take 1 tablet (4 mg total) by mouth every 8 (eight) hours as needed for nausea or vomiting. 20 tablet 0   tamsulosin (FLOMAX) 0.4 MG CAPS capsule Take 0.4 mg by mouth.     aspirin EC 81 MG tablet Take 81 mg by mouth. (Patient not taking: Reported on 02/12/2024)     docusate sodium  (COLACE) 100 MG capsule Take 1 capsule (100 mg total) by mouth 2 (two) times daily. (Patient not taking: Reported on 02/12/2024) 60 capsule 0   No current facility-administered medications for this visit.   Facility-Administered Medications Ordered in Other Visits  Medication Dose Route Frequency Provider Last Rate Last Admin   0.9 %  sodium chloride  infusion   Intravenous Continuous Reda Citron J, MD 10 mL/hr at 02/12/24 0953 New Bag at 02/12/24 0953   riTUXimab -pvvr (RUXIENCE ) 900 mg in sodium chloride  0.9 % 250 mL (2.6471 mg/mL) infusion  375 mg/m2 (Treatment Plan Recorded) Intravenous Once Jacobo Evalene PARAS, MD        OBJECTIVE: Vitals:   02/12/24 0903  BP: 137/67  Pulse: 70  Resp: 16  Temp: 97.6 F (36.4 C)  SpO2: 98%     Body mass index is 31.46 kg/m.    ECOG FS:0 - Asymptomatic  General: Well-developed, well-nourished, no acute distress. Eyes: Pink conjunctiva, anicteric sclera. HEENT: Normocephalic, moist mucous membranes. Lungs: No audible wheezing or coughing. Heart: Regular rate and rhythm. Abdomen: Soft, nontender, no obvious distention. Musculoskeletal: No edema, cyanosis, or clubbing. Neuro: Alert, answering all questions appropriately. Cranial nerves grossly intact. Skin: No rashes or petechiae noted. Psych: Normal affect.  LAB RESULTS:  Lab Results  Component Value Date   NA 143 02/12/2024   K 3.8 02/12/2024   CL 107  02/12/2024   CO2 26 02/12/2024   GLUCOSE 91 02/12/2024   BUN 23 02/12/2024   CREATININE 1.34 (H) 02/12/2024   CALCIUM 9.3 02/12/2024   PROT 6.4 (L) 02/12/2024   ALBUMIN 4.1 02/12/2024   AST 18 02/12/2024   ALT 13 02/12/2024   ALKPHOS 89 02/12/2024   BILITOT 0.5 02/12/2024   GFRNONAA 54 (L) 02/12/2024   GFRAA >90 03/21/2014    Lab Results  Component Value Date   WBC 6.6 02/12/2024   NEUTROABS 3.8 02/12/2024   HGB 12.6 (L) 02/12/2024   HCT 37.3 (L) 02/12/2024   MCV 94.9 02/12/2024   PLT 140 (L) 02/12/2024     STUDIES: No results found.   ASSESSMENT: Stage III follicular lymphoma.SABRA  PLAN:    Stage III follicular lymphoma: Resection of lymph node on January 07, 2024 confirmed diagnosis.  PET scan results from December 11, 2023 reviewed independently revealing hypermetabolic bilateral cervical, left axillary, abdominal periaortic adenopathy and right inguinal lymph node confirming stage of disease.  Bone marrow biopsy is not necessary at this time.  After lengthy discussion with the patient and his daughter whether to pursue continued active surveillance or treatment, the patient has elected treatment with weekly Rituxan  x 4.  Will get a PET scan approximately 6 to 8 weeks after completing treatment at which point patient will decide if he wishes to pursue maintenance Rituxan  every 8 weeks.  Return to clinic in 1 week for further evaluation and consideration of cycle 2.  I spent a total of 30 minutes reviewing chart data, face-to-face evaluation with the patient, counseling and coordination of care as detailed above.  Patient expressed understanding and was in agreement with this plan. He also understands that He can call clinic at any time with any questions, concerns, or complaints.    Cancer Staging  Follicular lymphoma (HCC) Staging form: Hodgkin and Non-Hodgkin Lymphoma, AJCC 8th Edition - Clinical stage from 01/14/2024: Stage III - Signed by Jacobo Evalene PARAS, MD on  01/14/2024 Stage prefix: Initial diagnosis   Evalene PARAS Jacobo, MD   02/12/2024 10:25 AM     "

## 2024-02-13 LAB — HEPATITIS B CORE ANTIBODY, TOTAL: HEP B CORE AB: NEGATIVE

## 2024-02-19 ENCOUNTER — Inpatient Hospital Stay

## 2024-02-19 ENCOUNTER — Inpatient Hospital Stay: Admitting: Oncology

## 2024-02-19 ENCOUNTER — Encounter: Payer: Self-pay | Admitting: Oncology

## 2024-02-19 VITALS — BP 142/84 | HR 67 | Temp 97.2°F | Resp 18 | Ht 72.0 in | Wt 234.0 lb

## 2024-02-19 VITALS — BP 162/78 | HR 54 | Temp 95.7°F | Resp 17

## 2024-02-19 DIAGNOSIS — C8220 Follicular lymphoma grade III, unspecified, unspecified site: Secondary | ICD-10-CM

## 2024-02-19 DIAGNOSIS — Z5112 Encounter for antineoplastic immunotherapy: Secondary | ICD-10-CM | POA: Diagnosis not present

## 2024-02-19 MED ORDER — SODIUM CHLORIDE 0.9 % IV SOLN
375.0000 mg/m2 | Freq: Once | INTRAVENOUS | Status: AC
  Administered 2024-02-19: 900 mg via INTRAVENOUS
  Filled 2024-02-19: qty 40

## 2024-02-19 MED ORDER — SODIUM CHLORIDE 0.9 % IV SOLN
INTRAVENOUS | Status: DC
  Filled 2024-02-19: qty 250

## 2024-02-19 MED ORDER — ACETAMINOPHEN 325 MG PO TABS
650.0000 mg | ORAL_TABLET | Freq: Once | ORAL | Status: AC
  Administered 2024-02-19: 650 mg via ORAL
  Filled 2024-02-19: qty 2

## 2024-02-19 MED ORDER — DIPHENHYDRAMINE HCL 25 MG PO TABS
25.0000 mg | ORAL_TABLET | Freq: Once | ORAL | Status: AC
  Administered 2024-02-19: 25 mg via ORAL
  Filled 2024-02-19: qty 1

## 2024-02-19 NOTE — Progress Notes (Signed)
 Patient just started having some right leg pain, feels like a pulled muscle, mostly when walking and stretching out his leg.

## 2024-02-19 NOTE — Progress Notes (Signed)
 " [facility]  [phone] [phone]) [phone]  ID: Sean Kidd OB: [birth year]  MR#: [id number]  RDW#:[id number]  Patient Care Team: Cleotilde Oneil FALCON, MD as PCP - General (Internal Medicine) Jacobo Evalene PARAS, MD as Consulting Physician (Oncology)  CHIEF COMPLAINT: Stage III follicular lymphoma.  INTERVAL HISTORY: Patient returns to clinic today for further evaluation and consideration of cycle 2 of 4 of weekly Rituxan .  He tolerated his first treatment well without significant side effects.  He currently feels well and is asymptomatic.  He has no neurologic complaints.  He denies any recent fevers or illnesses.  He has a good appetite and denies weight loss.  He has no chest pain, shortness of breath, cough, or hemoptysis.  He denies any nausea, vomiting, constipation, or diarrhea.  He has no urinary complaints.  Patient offers no specific complaints today.  REVIEW OF SYSTEMS:   Review of Systems  Constitutional: Negative.  Negative for fever, malaise/fatigue and weight loss.  Respiratory: Negative.  Negative for cough, hemoptysis and shortness of breath.   Cardiovascular:  Negative for chest pain and leg swelling.  Gastrointestinal: Negative.  Negative for abdominal pain.  Genitourinary: Negative.  Negative for dysuria.  Musculoskeletal: Negative.  Negative for back pain.  Skin: Negative.  Negative for rash.  Neurological: Negative.  Negative for dizziness, focal weakness, weakness and headaches.  Psychiatric/Behavioral: Negative.  The patient is not nervous/anxious.     As per HPI. Otherwise, a complete review of systems is negative.  PAST MEDICAL HISTORY: Past Medical History:  Diagnosis Date   Abnormal prostate specific antigen (PSA)    Asthma    as a child only   B-cell lymphoma (HCC)    BPH (benign prostatic hyperplasia)    CKD (chronic kidney disease), stage III (HCC)    Colon adenoma    GERD (gastroesophageal reflux disease)     Hypertension    Kidney stones    Pneumonia    Submandibular gland mass    Wears glasses     PAST SURGICAL HISTORY: Past Surgical History:  Procedure Laterality Date   COLONOSCOPY W/ BIOPSIES AND POLYPECTOMY     COLONOSCOPY WITH PROPOFOL  N/A 12/09/2017   Procedure: COLONOSCOPY WITH PROPOFOL ;  Surgeon: Toledo, Ladell POUR, MD;  Location: [facility];  Service: Gastroenterology;  Laterality: N/A;   ESOPHAGOGASTRODUODENOSCOPY (EGD) WITH PROPOFOL  N/A 12/09/2017   Procedure: ESOPHAGOGASTRODUODENOSCOPY (EGD) WITH PROPOFOL ;  Surgeon: Toledo, Ladell POUR, MD;  Location: [facility];  Service: Gastroenterology;  Laterality: N/A;   EXCISION MASS NECK Right 01/07/2024   Procedure: EXCISION, MASS, NECK;  Surgeon: Milissa Hamming, MD;  Location: [facility];  Service: ENT;  Laterality: Right;  EXCISION RIGHT NECK MASS   LITHOTRIPSY     for kidney stones   LUMBAR LAMINECTOMY/DECOMPRESSION MICRODISCECTOMY Left 03/23/2014   Procedure: LUMBAR LAMINECTOMY/DECOMPRESSION MICRODISCECTOMY 1 LEVEL;  Surgeon: Reyes Budge, MD;  Location: [facility];  Service: Neurosurgery;  Laterality: Left;  Left L34 Laminectomy and foraminotomy for synovial cyst   ROTATOR CUFF REPAIR     x 3   TONSILLECTOMY      FAMILY HISTORY: Family History  Problem Relation Age of Onset   Cancer Mother    Cancer - Lung Father    Prostate cancer Brother     ADVANCED DIRECTIVES (Y/N):  N  HEALTH MAINTENANCE: Social History   Tobacco Use   Smoking status: Never   Smokeless tobacco: Never  Substance Use Topics   Drug use: No     Colonoscopy:  PAP:  Bone density:  Lipid panel:  No Known Allergies  Current Outpatient Medications  Medication Sig Dispense Refill   Cholecalciferol (VITAMIN D-1000 MAX ST) 25 MCG (1000 UT) tablet Take 1,000 Units by mouth.     doxazosin (CARDURA) 4 MG tablet Take 4 mg by mouth at bedtime.     HYDROcodone -acetaminophen  (NORCO/VICODIN) 5-325 MG tablet Take 1 tablet by mouth every 6 (six)  hours as needed for moderate pain (pain score 4-6). 10 tablet 0   lisinopril -hydrochlorothiazide  (PRINZIDE ,ZESTORETIC ) 20-12.5 MG per tablet Take 1 tablet by mouth daily.     omeprazole (PRILOSEC) 40 MG capsule Take 40 mg by mouth daily.     ondansetron  (ZOFRAN ) 4 MG tablet Take 1 tablet (4 mg total) by mouth every 8 (eight) hours as needed for nausea or vomiting. 20 tablet 0   tamsulosin (FLOMAX) 0.4 MG CAPS capsule Take 0.4 mg by mouth.     aspirin EC 81 MG tablet Take 81 mg by mouth. (Patient not taking: Reported on 02/12/2024)     docusate sodium  (COLACE) 100 MG capsule Take 1 capsule (100 mg total) by mouth 2 (two) times daily. (Patient not taking: Reported on 02/19/2024) 60 capsule 0   No current facility-administered medications for this visit.   Facility-Administered Medications Ordered in Other Visits  Medication Dose Route Frequency Provider Last Rate Last Admin   0.9 %  sodium chloride  infusion   Intravenous Continuous Jacobo, Letishia Elliott J, MD       acetaminophen  (TYLENOL ) tablet 650 mg  650 mg Oral Once Mellissa Conley J, MD       diphenhydrAMINE  (BENADRYL ) capsule 25 mg  25 mg Oral Once Kateri Balch J, MD       riTUXimab -pvvr (RUXIENCE ) 900 mg in sodium chloride  0.9 % 250 mL (2.6471 mg/mL) infusion  375 mg/m2 (Treatment Plan Recorded) Intravenous Once Jacobo Evalene PARAS, MD        OBJECTIVE: Vitals:   02/19/24 0905 02/19/24 0906  BP: (!) 147/76 (!) 142/84  Pulse: 67   Resp: 18   Temp: (!) 97.2 F (36.2 C)   SpO2: 97%      Body mass index is 31.74 kg/m.    ECOG FS:0 - Asymptomatic  General: Well-developed, well-nourished, no acute distress. Eyes: Pink conjunctiva, anicteric sclera. HEENT: Normocephalic, moist mucous membranes. Lungs: No audible wheezing or coughing. Heart: Regular rate and rhythm. Abdomen: Soft, nontender, no obvious distention. Musculoskeletal: No edema, cyanosis, or clubbing. Neuro: Alert, answering all questions appropriately. Cranial nerves  grossly intact. Skin: No rashes or petechiae noted. Psych: Normal affect.  LAB RESULTS:  Lab Results  Component Value Date   NA 143 02/12/2024   K 3.8 02/12/2024   CL 107 02/12/2024   CO2 26 02/12/2024   GLUCOSE 91 02/12/2024   BUN 23 02/12/2024   CREATININE 1.34 (H) 02/12/2024   CALCIUM 9.3 02/12/2024   PROT 6.4 (L) 02/12/2024   ALBUMIN 4.1 02/12/2024   AST 18 02/12/2024   ALT 13 02/12/2024   ALKPHOS 89 02/12/2024   BILITOT 0.5 02/12/2024   GFRNONAA 54 (L) 02/12/2024   GFRAA >90 03/21/2014    Lab Results  Component Value Date   WBC 6.6 02/12/2024   NEUTROABS 3.8 02/12/2024   HGB 12.6 (L) 02/12/2024   HCT 37.3 (L) 02/12/2024   MCV 94.9 02/12/2024   PLT 140 (L) 02/12/2024     STUDIES: No results found.   ASSESSMENT: Stage III follicular lymphoma.  PLAN:    Stage III follicular lymphoma: Resection of  lymph node on January 07, 2024 confirmed diagnosis.  PET scan results from December 11, 2023 reviewed independently revealing hypermetabolic bilateral cervical, left axillary, abdominal periaortic adenopathy and right inguinal lymph node confirming stage of disease.  Bone marrow biopsy is not necessary at this time.  After lengthy discussion with the patient and his daughter whether to pursue continued active surveillance or treatment, the patient has elected treatment with weekly Rituxan  x 4.  Will get a PET scan approximately 6 to 8 weeks after completing treatment at which point patient will decide if he wishes to pursue maintenance Rituxan  every 8 weeks.  Proceed with cycle 2 of treatment today.  Return to clinic in 1 week for further evaluation and consideration of cycle 3.  I spent a total of 30 minutes reviewing chart data, face-to-face evaluation with the patient, counseling and coordination of care as detailed above.   Patient expressed understanding and was in agreement with this plan. He also understands that He can call clinic at any time with any questions,  concerns, or complaints.    Cancer Staging  Follicular lymphoma (HCC) Staging form: Hodgkin and Non-Hodgkin Lymphoma, AJCC 8th Edition - Clinical stage from 01/14/2024: Stage III - Signed by Jacobo Evalene PARAS, MD on 01/14/2024 Stage prefix: Initial diagnosis   Evalene PARAS Jacobo, MD   02/19/2024 10:01 AM     "

## 2024-02-19 NOTE — Progress Notes (Signed)
 RN ran at susquent dose today for 2nd Infusion today. Inquired with pharmacy, per pharmcy raipd will be assesd and changed for future doses

## 2024-02-19 NOTE — Progress Notes (Signed)
 About 30 min into the Rituxamb and 2nd rate increase patient's BP went up to 211/80 P 55 rechecked 186/85 P 53 check 1 hr later 172/104 rechecked manual 160/78, patient denies any symptoms. MD notified, continue to monitor

## 2024-02-19 NOTE — Patient Instructions (Signed)
 Rituximab Injection What is this medication? RITUXIMAB (ri TUX i mab) treats leukemia and lymphoma. It works by blocking a protein that causes cancer cells to grow and multiply. This helps to slow or stop the spread of cancer cells. It may also be used to treat autoimmune conditions, such as arthritis. It works by slowing down an overactive immune system. It is a monoclonal antibody. This medicine may be used for other purposes; ask your health care provider or pharmacist if you have questions. COMMON BRAND NAME(S): RIABNI, Rituxan, RUXIENCE, truxima What should I tell my care team before I take this medication? They need to know if you have any of these conditions: Chest pain Heart disease Immune system problems Infection, such as chickenpox, cold sores, hepatitis B, herpes Irregular heartbeat or rhythm Kidney disease Low blood counts, such as low white cells, platelets, red cells Lung disease Recent or upcoming vaccine An unusual or allergic reaction to rituximab, other medications, foods, dyes, or preservatives Pregnant or trying to get pregnant Breast-feeding How should I use this medication? This medication is injected into a vein. It is given by a care team in a hospital or clinic setting. A special MedGuide will be given to you before each treatment. Be sure to read this information carefully each time. Talk to your care team about the use of this medication in children. While this medication may be prescribed for children as young as 6 months for selected conditions, precautions do apply. Overdosage: If you think you have taken too much of this medicine contact a poison control center or emergency room at once. NOTE: This medicine is only for you. Do not share this medicine with others. What if I miss a dose? Keep appointments for follow-up doses. It is important not to miss your dose. Call your care team if you are unable to keep an appointment. What may interact with this  medication? Do not take this medication with any of the following: Live vaccines This medication may also interact with the following: Cisplatin This list may not describe all possible interactions. Give your health care provider a list of all the medicines, herbs, non-prescription drugs, or dietary supplements you use. Also tell them if you smoke, drink alcohol, or use illegal drugs. Some items may interact with your medicine. What should I watch for while using this medication? Your condition will be monitored carefully while you are receiving this medication. You may need blood work while taking this medication. This medication can cause serious infusion reactions. To reduce the risk your care team may give you other medications to take before receiving this one. Be sure to follow the directions from your care team. This medication may increase your risk of getting an infection. Call your care team for advice if you get a fever, chills, sore throat, or other symptoms of a cold or flu. Do not treat yourself. Try to avoid being around people who are sick. Call your care team if you are around anyone with measles, chickenpox, or if you develop sores or blisters that do not heal properly. Avoid taking medications that contain aspirin, acetaminophen, ibuprofen, naproxen, or ketoprofen unless instructed by your care team. These medications may hide a fever. This medication may cause serious skin reactions. They can happen weeks to months after starting the medication. Contact your care team right away if you notice fevers or flu-like symptoms with a rash. The rash may be red or purple and then turn into blisters or peeling of the skin.  You may also notice a red rash with swelling of the face, lips, or lymph nodes in your neck or under your arms. In some patients, this medication may cause a serious brain infection that may cause death. If you have any problems seeing, thinking, speaking, walking, or  standing, tell your care team right away. If you cannot reach your care team, urgently seek another source of medical care. Talk to your care team if you may be pregnant. Serious birth defects can occur if you take this medication during pregnancy and for 12 months after the last dose. You will need a negative pregnancy test before starting this medication. Contraception is recommended while taking this medication and for 12 months after the last dose. Your care team can help you find the option that works for you. Do not breastfeed while taking this medication and for at least 6 months after the last dose. What side effects may I notice from receiving this medication? Side effects that you should report to your care team as soon as possible: Allergic reactions or angioedema--skin rash, itching or hives, swelling of the face, eyes, lips, tongue, arms, or legs, trouble swallowing or breathing Bowel blockage--stomach cramping, unable to have a bowel movement or pass gas, loss of appetite, vomiting Dizziness, loss of balance or coordination, confusion or trouble speaking Heart attack--pain or tightness in the chest, shoulders, arms, or jaw, nausea, shortness of breath, cold or clammy skin, feeling faint or lightheaded Heart rhythm changes--fast or irregular heartbeat, dizziness, feeling faint or lightheaded, chest pain, trouble breathing Infection--fever, chills, cough, sore throat, wounds that don't heal, pain or trouble when passing urine, general feeling of discomfort or being unwell Infusion reactions--chest pain, shortness of breath or trouble breathing, feeling faint or lightheaded Kidney injury--decrease in the amount of urine, swelling of the ankles, hands, or feet Liver injury--right upper belly pain, loss of appetite, nausea, light-colored stool, dark yellow or brown urine, yellowing skin or eyes, unusual weakness or fatigue Redness, blistering, peeling, or loosening of the skin, including  inside the mouth Stomach pain that is severe, does not go away, or gets worse Tumor lysis syndrome (TLS)--nausea, vomiting, diarrhea, decrease in the amount of urine, dark urine, unusual weakness or fatigue, confusion, muscle pain or cramps, fast or irregular heartbeat, joint pain Side effects that usually do not require medical attention (report to your care team if they continue or are bothersome): Headache Joint pain Nausea Runny or stuffy nose Unusual weakness or fatigue This list may not describe all possible side effects. Call your doctor for medical advice about side effects. You may report side effects to FDA at 1-800-FDA-1088. Where should I keep my medication? This medication is given in a hospital or clinic. It will not be stored at home. NOTE: This sheet is a summary. It may not cover all possible information. If you have questions about this medicine, talk to your doctor, pharmacist, or health care provider.  2024 Elsevier/Gold Standard (2021-06-13 00:00:00)

## 2024-02-22 NOTE — Progress Notes (Signed)
 Rapid Infusion Rituximab  Pharmacist Evaluation  ZAKAI GONYEA is a 79 y.o. male being treated with rituximab  for 02/26/24. This patient may be considered for RIR.   A pharmacist has verified the patient tolerated rituximab  infusions per the [facility] standard infusion protocol without grade 3-4 infusion reactions. The treatment plan will be updated to reflect RIR if the patient qualifies per the checklist below:   Age > 3 years old Yes   Clinically significant cardiovascular disease No   Circulating lymphocyte count < 5000/uL prior to cycle two Yes  Lab Results  Component Value Date   LYMPHSABS 1.4 02/12/2024    Prior documented grade 3-4 infusion reaction to rituximab  No   Prior documented grade 1-2 infusion reaction to rituximab  (If YES, Pharmacist will confirm with Physician if patient is still a candidate for RIR) No   Previous rituximab  infusion within the past 6 months No   Treatment Plan updated orders to reflect RIR Yes    DAYNA GEURTS does meet the criteria for Rapid Infusion Rituximab . This patient is going to be switched to rapid infusion rituximab .   Redell PARAS [facility] 02/22/24 2:18 PM

## 2024-02-23 ENCOUNTER — Other Ambulatory Visit: Payer: Self-pay | Admitting: Oncology

## 2024-02-23 DIAGNOSIS — C8220 Follicular lymphoma grade III, unspecified, unspecified site: Secondary | ICD-10-CM

## 2024-02-24 ENCOUNTER — Other Ambulatory Visit: Payer: Self-pay

## 2024-02-25 ENCOUNTER — Other Ambulatory Visit: Payer: Self-pay | Admitting: *Deleted

## 2024-02-25 DIAGNOSIS — C8220 Follicular lymphoma grade III, unspecified, unspecified site: Secondary | ICD-10-CM

## 2024-02-26 ENCOUNTER — Inpatient Hospital Stay

## 2024-02-26 ENCOUNTER — Encounter: Payer: Self-pay | Admitting: Oncology

## 2024-02-26 ENCOUNTER — Inpatient Hospital Stay: Admitting: Oncology

## 2024-02-26 VITALS — BP 118/67 | HR 70 | Temp 97.9°F | Ht 72.0 in | Wt 234.0 lb

## 2024-02-26 VITALS — BP 161/75 | HR 55 | Resp 18

## 2024-02-26 DIAGNOSIS — C8220 Follicular lymphoma grade III, unspecified, unspecified site: Secondary | ICD-10-CM

## 2024-02-26 DIAGNOSIS — Z5112 Encounter for antineoplastic immunotherapy: Secondary | ICD-10-CM | POA: Diagnosis not present

## 2024-02-26 LAB — CBC WITH DIFFERENTIAL/PLATELET
Abs Immature Granulocytes: 0.03 K/uL (ref 0.00–0.07)
Basophils Absolute: 0 K/uL (ref 0.0–0.1)
Basophils Relative: 1 %
Eosinophils Absolute: 0.7 K/uL — ABNORMAL HIGH (ref 0.0–0.5)
Eosinophils Relative: 11 %
HCT: 36.9 % — ABNORMAL LOW (ref 39.0–52.0)
Hemoglobin: 12.3 g/dL — ABNORMAL LOW (ref 13.0–17.0)
Immature Granulocytes: 1 %
Lymphocytes Relative: 20 %
Lymphs Abs: 1.3 K/uL (ref 0.7–4.0)
MCH: 31.5 pg (ref 26.0–34.0)
MCHC: 33.3 g/dL (ref 30.0–36.0)
MCV: 94.4 fL (ref 80.0–100.0)
Monocytes Absolute: 0.7 K/uL (ref 0.1–1.0)
Monocytes Relative: 11 %
Neutro Abs: 3.7 K/uL (ref 1.7–7.7)
Neutrophils Relative %: 56 %
Platelets: 148 K/uL — ABNORMAL LOW (ref 150–400)
RBC: 3.91 MIL/uL — ABNORMAL LOW (ref 4.22–5.81)
RDW: 12.7 % (ref 11.5–15.5)
WBC: 6.5 K/uL (ref 4.0–10.5)
nRBC: 0 % (ref 0.0–0.2)

## 2024-02-26 LAB — CMP (CANCER CENTER ONLY)
ALT: 17 U/L (ref 0–44)
AST: 20 U/L (ref 15–41)
Albumin: 4 g/dL (ref 3.5–5.0)
Alkaline Phosphatase: 95 U/L (ref 38–126)
Anion gap: 8 (ref 5–15)
BUN: 20 mg/dL (ref 8–23)
CO2: 27 mmol/L (ref 22–32)
Calcium: 9.1 mg/dL (ref 8.9–10.3)
Chloride: 107 mmol/L (ref 98–111)
Creatinine: 1.19 mg/dL (ref 0.61–1.24)
GFR, Estimated: >60 mL/min
Glucose, Bld: 109 mg/dL — ABNORMAL HIGH (ref 70–99)
Potassium: 3.8 mmol/L (ref 3.5–5.1)
Sodium: 142 mmol/L (ref 135–145)
Total Bilirubin: 0.7 mg/dL (ref 0.0–1.2)
Total Protein: 6.3 g/dL — ABNORMAL LOW (ref 6.5–8.1)

## 2024-02-26 MED ORDER — METHYLPREDNISOLONE SODIUM SUCC 125 MG IJ SOLR: 125.0000 mg | Freq: Once | INTRAMUSCULAR | Status: DC | PRN

## 2024-02-26 MED ORDER — SODIUM CHLORIDE 0.9 % IV SOLN
INTRAVENOUS | Status: DC
  Filled 2024-02-26: qty 250

## 2024-02-26 MED ORDER — SODIUM CHLORIDE 0.9 % IV SOLN
375.0000 mg/m2 | Freq: Once | INTRAVENOUS | Status: AC
  Administered 2024-02-26: 900 mg via INTRAVENOUS
  Filled 2024-02-26: qty 40

## 2024-02-26 MED ORDER — EPINEPHRINE 0.3 MG/0.3ML IJ SOAJ: 0.3000 mg | Freq: Once | INTRAMUSCULAR | Status: DC | PRN

## 2024-02-26 MED ORDER — SODIUM CHLORIDE 0.9 % IV SOLN
Freq: Once | INTRAVENOUS | Status: DC | PRN
  Filled 2024-02-26: qty 250

## 2024-02-26 MED ORDER — ACETAMINOPHEN 325 MG PO TABS
650.0000 mg | ORAL_TABLET | Freq: Once | ORAL | Status: AC
  Administered 2024-02-26: 650 mg via ORAL
  Filled 2024-02-26: qty 2

## 2024-02-26 MED ORDER — DIPHENHYDRAMINE HCL 50 MG/ML IJ SOLN: 50.0000 mg | Freq: Once | INTRAMUSCULAR | Status: DC | PRN

## 2024-02-26 MED ORDER — ALBUTEROL SULFATE HFA 108 (90 BASE) MCG/ACT IN AERS: 2.0000 | INHALATION_SPRAY | Freq: Once | RESPIRATORY_TRACT | Status: DC | PRN

## 2024-02-26 MED ORDER — FAMOTIDINE IN NACL 20-0.9 MG/50ML-% IV SOLN: 20.0000 mg | Freq: Once | INTRAVENOUS | Status: DC | PRN

## 2024-02-26 MED ORDER — DIPHENHYDRAMINE HCL 25 MG PO TABS
25.0000 mg | ORAL_TABLET | Freq: Once | ORAL | Status: AC
  Administered 2024-02-26: 25 mg via ORAL
  Filled 2024-02-26: qty 1

## 2024-02-26 NOTE — Progress Notes (Signed)
 Patient states no complaints or concerns today.

## 2024-02-26 NOTE — Progress Notes (Signed)
 " [facility]  [phone] [phone]) [phone]  ID: Sean Kidd OB: April 08, 1945  MR#: [id number]  RDW#:[id number]  Patient Care Team: Cleotilde Oneil FALCON, MD as PCP - General (Internal Medicine) Jacobo Evalene PARAS, MD as Consulting Physician (Oncology)  CHIEF COMPLAINT: Stage III follicular lymphoma.  INTERVAL HISTORY: Patient returns to clinic today for further evaluation and consideration of cycle 3 of 4 of weekly Rituxan .  He currently feels well and is asymptomatic.  He is tolerating his treatments without significant side effects.  He has no neurologic complaints.  He denies any recent fevers or illnesses.  He has a good appetite and denies weight loss.  He has no chest pain, shortness of breath, cough, or hemoptysis.  He denies any nausea, vomiting, constipation, or diarrhea.  He has no urinary complaints.  Patient offers no specific complaints today.  REVIEW OF SYSTEMS:   Review of Systems  Constitutional: Negative.  Negative for fever, malaise/fatigue and weight loss.  Respiratory: Negative.  Negative for cough, hemoptysis and shortness of breath.   Cardiovascular:  Negative for chest pain and leg swelling.  Gastrointestinal: Negative.  Negative for abdominal pain.  Genitourinary: Negative.  Negative for dysuria.  Musculoskeletal: Negative.  Negative for back pain.  Skin: Negative.  Negative for rash.  Neurological: Negative.  Negative for dizziness, focal weakness, weakness and headaches.  Psychiatric/Behavioral: Negative.  The patient is not nervous/anxious.     As per HPI. Otherwise, a complete review of systems is negative.  PAST MEDICAL HISTORY: Past Medical History:  Diagnosis Date   Abnormal prostate specific antigen (PSA)    Asthma    as a child only   B-cell lymphoma (HCC)    BPH (benign prostatic hyperplasia)    CKD (chronic kidney disease), stage III (HCC)    Colon adenoma    GERD (gastroesophageal reflux disease)    Hypertension     Kidney stones    Pneumonia    Submandibular gland mass    Wears glasses     PAST SURGICAL HISTORY: Past Surgical History:  Procedure Laterality Date   COLONOSCOPY W/ BIOPSIES AND POLYPECTOMY     COLONOSCOPY WITH PROPOFOL  N/A 12/09/2017   Procedure: COLONOSCOPY WITH PROPOFOL ;  Surgeon: Toledo, Ladell POUR, MD;  Location: [facility];  Service: Gastroenterology;  Laterality: N/A;   ESOPHAGOGASTRODUODENOSCOPY (EGD) WITH PROPOFOL  N/A 12/09/2017   Procedure: ESOPHAGOGASTRODUODENOSCOPY (EGD) WITH PROPOFOL ;  Surgeon: Toledo, Ladell POUR, MD;  Location: [facility] END[facility];  Service: Gastroenterology;  Laterality: N/A;   EXCISION MASS NECK Right 01/07/2024   Procedure: EXCISION, MASS, NECK;  Surgeon: Milissa Hamming, MD;  Location: [facility] ORS;  Service: ENT;  Laterality: Right;  EXCISION RIGHT NECK MASS   LITHOTRIPSY     for kidney stones   LUMBAR LAMINECTOMY/DECOMPRESSION MICRODISCECTOMY Left 03/23/2014   Procedure: LUMBAR LAMINECTOMY/DECOMPRESSION MICRODISCECTOMY 1 LEVEL;  Surgeon: Reyes Budge, MD;  Location: [facility];  Service: Neurosurgery;  Laterality: Left;  Left L34 Laminectomy and foraminotomy for synovial cyst   ROTATOR CUFF REPAIR     x 3   TONSILLECTOMY      FAMILY HISTORY: Family History  Problem Relation Age of Onset   Cancer Mother    Cancer - Lung Father    Prostate cancer Brother     ADVANCED DIRECTIVES (Y/N):  N  HEALTH MAINTENANCE: Social History   Tobacco Use   Smoking status: Never   Smokeless tobacco: Never  Substance Use Topics   Drug use: No     Colonoscopy:  PAP:  Bone density:  Lipid panel:  No Known Allergies  Current Outpatient Medications  Medication Sig Dispense Refill   aspirin EC 81 MG tablet Take 81 mg by mouth.     Cholecalciferol (VITAMIN D-1000 MAX ST) 25 MCG (1000 UT) tablet Take 1,000 Units by mouth.     doxazosin (CARDURA) 4 MG tablet Take 4 mg by mouth at bedtime.     HYDROcodone -acetaminophen  (NORCO/VICODIN) 5-325 MG tablet Take 1  tablet by mouth every 6 (six) hours as needed for moderate pain (pain score 4-6). 10 tablet 0   lisinopril -hydrochlorothiazide  (PRINZIDE ,ZESTORETIC ) 20-12.5 MG per tablet Take 1 tablet by mouth daily.     omeprazole (PRILOSEC) 40 MG capsule Take 40 mg by mouth daily.     ondansetron  (ZOFRAN ) 4 MG tablet Take 1 tablet (4 mg total) by mouth every 8 (eight) hours as needed for nausea or vomiting. 20 tablet 0   docusate sodium  (COLACE) 100 MG capsule Take 1 capsule (100 mg total) by mouth 2 (two) times daily. (Patient not taking: Reported on 02/19/2024) 60 capsule 0   tamsulosin (FLOMAX) 0.4 MG CAPS capsule Take 0.4 mg by mouth. (Patient not taking: Reported on 02/26/2024)     No current facility-administered medications for this visit.    OBJECTIVE: Vitals:   02/26/24 0819  BP: 118/67  Pulse: 70  Temp: 97.9 F (36.6 C)  SpO2: 99%     Body mass index is 31.74 kg/m.    ECOG FS:0 - Asymptomatic  General: Well-developed, well-nourished, no acute distress. Eyes: Pink conjunctiva, anicteric sclera. HEENT: Normocephalic, moist mucous membranes. Lungs: No audible wheezing or coughing. Heart: Regular rate and rhythm. Abdomen: Soft, nontender, no obvious distention. Musculoskeletal: No edema, cyanosis, or clubbing. Neuro: Alert, answering all questions appropriately. Cranial nerves grossly intact. Skin: No rashes or petechiae noted. Psych: Normal affect.   LAB RESULTS:  Lab Results  Component Value Date   NA 142 02/26/2024   K 3.8 02/26/2024   CL 107 02/26/2024   CO2 27 02/26/2024   GLUCOSE 109 (H) 02/26/2024   BUN 20 02/26/2024   CREATININE 1.19 02/26/2024   CALCIUM 9.1 02/26/2024   PROT 6.3 (L) 02/26/2024   ALBUMIN 4.0 02/26/2024   AST 20 02/26/2024   ALT 17 02/26/2024   ALKPHOS 95 02/26/2024   BILITOT 0.7 02/26/2024   GFRNONAA >60 02/26/2024   GFRAA >90 03/21/2014    Lab Results  Component Value Date   WBC 6.5 02/26/2024   NEUTROABS 3.7 02/26/2024   HGB 12.3 (L)  02/26/2024   HCT 36.9 (L) 02/26/2024   MCV 94.4 02/26/2024   PLT 148 (L) 02/26/2024     STUDIES: No results found.   ASSESSMENT: Stage III follicular lymphoma.  PLAN:    Stage III follicular lymphoma: Resection of lymph node on January 07, 2024 confirmed diagnosis.  PET scan results from December 11, 2023 reviewed independently revealing hypermetabolic bilateral cervical, left axillary, abdominal periaortic adenopathy and right inguinal lymph node confirming stage of disease.  Bone marrow biopsy is not necessary at this time.  After lengthy discussion with the patient and his daughter whether to pursue continued active surveillance or treatment, the patient has elected treatment with weekly Rituxan  x 4.  Will get a PET scan approximately 6 to 8 weeks after completing treatment at which point patient will decide if he wishes to pursue maintenance Rituxan  every 8 weeks.  Proceed with cycle 3 of treatment today.  Return to clinic in 1 week for further evaluation and consideration of  cycle 4.   Anemia: Mild.  Patient's hemoglobin is 12.3 today.  Monitor. Thrombocytopenia: Mild. Renal insufficiency: Resolved.   Patient expressed understanding and was in agreement with this plan. He also understands that He can call clinic at any time with any questions, concerns, or complaints.    Cancer Staging  Follicular lymphoma (HCC) Staging form: Hodgkin and Non-Hodgkin Lymphoma, AJCC 8th Edition - Clinical stage from 01/14/2024: Stage III - Signed by Jacobo Evalene PARAS, MD on 01/14/2024 Stage prefix: Initial diagnosis   Evalene PARAS Jacobo, MD   02/26/2024 9:09 AM     "

## 2024-03-01 ENCOUNTER — Encounter: Payer: Self-pay | Admitting: Oncology

## 2024-03-04 ENCOUNTER — Inpatient Hospital Stay

## 2024-03-04 ENCOUNTER — Inpatient Hospital Stay: Admitting: Oncology

## 2024-03-04 ENCOUNTER — Encounter: Payer: Self-pay | Admitting: Oncology

## 2024-03-04 VITALS — BP 151/83 | HR 62 | Temp 96.5°F | Resp 19

## 2024-03-04 VITALS — BP 144/76 | HR 73 | Temp 96.7°F | Resp 18 | Ht 72.0 in | Wt 235.0 lb

## 2024-03-04 DIAGNOSIS — C8220 Follicular lymphoma grade III, unspecified, unspecified site: Secondary | ICD-10-CM | POA: Diagnosis not present

## 2024-03-04 DIAGNOSIS — Z5112 Encounter for antineoplastic immunotherapy: Secondary | ICD-10-CM | POA: Diagnosis not present

## 2024-03-04 MED ORDER — ACETAMINOPHEN 325 MG PO TABS
650.0000 mg | ORAL_TABLET | Freq: Once | ORAL | Status: AC
  Administered 2024-03-04: 650 mg via ORAL
  Filled 2024-03-04: qty 2

## 2024-03-04 MED ORDER — DIPHENHYDRAMINE HCL 25 MG PO TABS
25.0000 mg | ORAL_TABLET | Freq: Once | ORAL | Status: AC
  Administered 2024-03-04: 25 mg via ORAL
  Filled 2024-03-04: qty 1

## 2024-03-04 MED ORDER — SODIUM CHLORIDE 0.9 % IV SOLN
375.0000 mg/m2 | Freq: Once | INTRAVENOUS | Status: AC
  Administered 2024-03-04: 900 mg via INTRAVENOUS
  Filled 2024-03-04: qty 40

## 2024-03-04 MED ORDER — SODIUM CHLORIDE 0.9 % IV SOLN
INTRAVENOUS | Status: DC
  Filled 2024-03-04: qty 250

## 2024-03-04 NOTE — Addendum Note (Signed)
 Addended by: BETHENE ALVIN RAMAN on: 03/04/2024 09:39 AM   Modules accepted: Orders

## 2024-03-04 NOTE — Addendum Note (Signed)
 Addended by: BETHENE ALVIN RAMAN on: 03/04/2024 09:38 AM   Modules accepted: Orders

## 2024-03-04 NOTE — Progress Notes (Signed)
 " [facility]  [phone] [phone]) [phone]  ID: Sean Kidd CHRISTELLA Chow OB: 05-01-1945  MR#: [id number]  RDW#:[id number]  Patient Care Team: Cleotilde Oneil FALCON, MD as PCP - General (Internal Medicine) Jacobo Evalene PARAS, MD as Consulting Physician (Oncology)  CHIEF COMPLAINT: Stage III follicular lymphoma.  INTERVAL HISTORY: Patient returns to clinic today for further evaluation and consideration of cycle 4 of 4 weekly Rituxan .  He continues to feel well and remains asymptomatic.  He continues to tolerate his treatments without significant side effects. He has no neurologic complaints.  He denies any recent fevers or illnesses.  He has a good appetite and denies weight loss.  He has no chest pain, shortness of breath, cough, or hemoptysis.  He denies any nausea, vomiting, constipation, or diarrhea.  He has no urinary complaints.  Patient offers no specific complaints today.  REVIEW OF SYSTEMS:   Review of Systems  Constitutional: Negative.  Negative for fever, malaise/fatigue and weight loss.  Respiratory: Negative.  Negative for cough, hemoptysis and shortness of breath.   Cardiovascular:  Negative for chest pain and leg swelling.  Gastrointestinal: Negative.  Negative for abdominal pain.  Genitourinary: Negative.  Negative for dysuria.  Musculoskeletal: Negative.  Negative for back pain.  Skin: Negative.  Negative for rash.  Neurological: Negative.  Negative for dizziness, focal weakness, weakness and headaches.  Psychiatric/Behavioral: Negative.  The patient is not nervous/anxious.     As per HPI. Otherwise, a complete review of systems is negative.  PAST MEDICAL HISTORY: Past Medical History:  Diagnosis Date   Abnormal prostate specific antigen (PSA)    Asthma    as a child only   B-cell lymphoma (HCC)    BPH (benign prostatic hyperplasia)    CKD (chronic kidney disease), stage III (HCC)    Colon adenoma    GERD (gastroesophageal reflux disease)     Hypertension    Kidney stones    Pneumonia    Submandibular gland mass    Wears glasses     PAST SURGICAL HISTORY: Past Surgical History:  Procedure Laterality Date   COLONOSCOPY W/ BIOPSIES AND POLYPECTOMY     COLONOSCOPY WITH PROPOFOL  N/A 12/09/2017   Procedure: COLONOSCOPY WITH PROPOFOL ;  Surgeon: Toledo, Ladell POUR, MD;  Location: [facility];  Service: Gastroenterology;  Laterality: N/A;   ESOPHAGOGASTRODUODENOSCOPY (EGD) WITH PROPOFOL  N/A 12/09/2017   Procedure: ESOPHAGOGASTRODUODENOSCOPY (EGD) WITH PROPOFOL ;  Surgeon: Toledo, Ladell POUR, MD;  Location: [facility];  Service: Gastroenterology;  Laterality: N/A;   EXCISION MASS NECK Right 01/07/2024   Procedure: EXCISION, MASS, NECK;  Surgeon: Milissa Hamming, MD;  Location: [facility];  Service: ENT;  Laterality: Right;  EXCISION RIGHT NECK MASS   LITHOTRIPSY     for kidney stones   LUMBAR LAMINECTOMY/DECOMPRESSION MICRODISCECTOMY Left 03/23/2014   Procedure: LUMBAR LAMINECTOMY/DECOMPRESSION MICRODISCECTOMY 1 LEVEL;  Surgeon: Reyes Budge, MD;  Location: [facility];  Service: Neurosurgery;  Laterality: Left;  Left L34 Laminectomy and foraminotomy for synovial cyst   ROTATOR CUFF REPAIR     x 3   TONSILLECTOMY      FAMILY HISTORY: Family History  Problem Relation Age of Onset   Cancer Mother    Cancer - Lung Father    Prostate cancer Brother     ADVANCED DIRECTIVES (Y/N):  N  HEALTH MAINTENANCE: Social History   Tobacco Use   Smoking status: Never   Smokeless tobacco: Never  Substance Use Topics   Drug use: No     Colonoscopy:  PAP:  Bone density:  Lipid panel:  No Known Allergies  Current Outpatient Medications  Medication Sig Dispense Refill   aspirin EC 81 MG tablet Take 81 mg by mouth.     Cholecalciferol (VITAMIN D-1000 MAX ST) 25 MCG (1000 UT) tablet Take 1,000 Units by mouth.     doxazosin (CARDURA) 4 MG tablet Take 4 mg by mouth at bedtime.     HYDROcodone -acetaminophen  (NORCO/VICODIN) 5-325  MG tablet Take 1 tablet by mouth every 6 (six) hours as needed for moderate pain (pain score 4-6). 10 tablet 0   lisinopril -hydrochlorothiazide  (PRINZIDE ,ZESTORETIC ) 20-12.5 MG per tablet Take 1 tablet by mouth daily.     omeprazole (PRILOSEC) 40 MG capsule Take 40 mg by mouth daily.     ondansetron  (ZOFRAN ) 4 MG tablet Take 1 tablet (4 mg total) by mouth every 8 (eight) hours as needed for nausea or vomiting. 20 tablet 0   docusate sodium  (COLACE) 100 MG capsule Take 1 capsule (100 mg total) by mouth 2 (two) times daily. (Patient not taking: Reported on 02/19/2024) 60 capsule 0   tamsulosin (FLOMAX) 0.4 MG CAPS capsule Take 0.4 mg by mouth. (Patient not taking: Reported on 02/26/2024)     No current facility-administered medications for this visit.    OBJECTIVE: Vitals:   03/04/24 0855  BP: (!) 144/76  Pulse: 73  Resp: 18  Temp: (!) 96.7 F (35.9 C)  SpO2: 98%     Body mass index is 31.87 kg/m.    ECOG FS:0 - Asymptomatic  General: Well-developed, well-nourished, no acute distress. Eyes: Pink conjunctiva, anicteric sclera. HEENT: Normocephalic, moist mucous membranes. Lungs: No audible wheezing or coughing. Heart: Regular rate and rhythm. Abdomen: Soft, nontender, no obvious distention. Musculoskeletal: No edema, cyanosis, or clubbing. Neuro: Alert, answering all questions appropriately. Cranial nerves grossly intact. Skin: No rashes or petechiae noted. Psych: Normal affect.   LAB RESULTS:  Lab Results  Component Value Date   NA 142 02/26/2024   K 3.8 02/26/2024   CL 107 02/26/2024   CO2 27 02/26/2024   GLUCOSE 109 (H) 02/26/2024   BUN 20 02/26/2024   CREATININE 1.19 02/26/2024   CALCIUM 9.1 02/26/2024   PROT 6.3 (L) 02/26/2024   ALBUMIN 4.0 02/26/2024   AST 20 02/26/2024   ALT 17 02/26/2024   ALKPHOS 95 02/26/2024   BILITOT 0.7 02/26/2024   GFRNONAA >60 02/26/2024   GFRAA >90 03/21/2014    Lab Results  Component Value Date   WBC 6.5 02/26/2024   NEUTROABS  3.7 02/26/2024   HGB 12.3 (L) 02/26/2024   HCT 36.9 (L) 02/26/2024   MCV 94.4 02/26/2024   PLT 148 (L) 02/26/2024     STUDIES: No results found.   ASSESSMENT: Stage III follicular lymphoma.  PLAN:    Stage III follicular lymphoma: Resection of lymph node on January 07, 2024 confirmed diagnosis.  PET scan results from December 11, 2023 reviewed independently revealing hypermetabolic bilateral cervical, left axillary, abdominal periaortic adenopathy and right inguinal lymph node confirming stage of disease.  Bone marrow biopsy is not necessary at this time.  After lengthy discussion with the patient and his daughter whether to pursue continued active surveillance or treatment, the patient has elected treatment with weekly Rituxan  x 4.  Proceed with cycle 4 today.  Return to clinic in 7 weeks with restaging PET scan and then in 8 weeks for further evaluation, discussion of his imaging and results, and initiation of maintenance Rituxan  every 8 weeks for 2 years.   Anemia: Mild.  Patient's most recent hemoglobin is 12.3. Thrombocytopenia: Mild. Renal insufficiency: Resolved.  I spent a total of 30 minutes reviewing chart data, face-to-face evaluation with the patient, counseling and coordination of care as detailed above.  Patient expressed understanding and was in agreement with this plan. He also understands that He can call clinic at any time with any questions, concerns, or complaints.    Cancer Staging  Follicular lymphoma (HCC) Staging form: Hodgkin and Non-Hodgkin Lymphoma, AJCC 8th Edition - Clinical stage from 01/14/2024: Stage III - Signed by Jacobo Evalene PARAS, MD on 01/14/2024 Stage prefix: Initial diagnosis   Evalene PARAS Jacobo, MD   03/04/2024 9:14 AM     "

## 2024-03-06 ENCOUNTER — Other Ambulatory Visit: Payer: Self-pay

## 2024-04-22 ENCOUNTER — Other Ambulatory Visit
